# Patient Record
Sex: Male | Born: 1965 | Race: White | Hispanic: No | Marital: Married | Smoking: Former smoker
Health system: Southern US, Community
[De-identification: ages and names within clinical notes are randomized; demographics above are authoritative.]

## PROBLEM LIST (undated history)

## (undated) DIAGNOSIS — E119 Type 2 diabetes mellitus without complications: Secondary | ICD-10-CM

## (undated) DIAGNOSIS — I219 Acute myocardial infarction, unspecified: Secondary | ICD-10-CM

## (undated) DIAGNOSIS — E279 Disorder of adrenal gland, unspecified: Secondary | ICD-10-CM

## (undated) DIAGNOSIS — E785 Hyperlipidemia, unspecified: Secondary | ICD-10-CM

## (undated) HISTORY — PX: CORONARY ARTERY BYPASS GRAFT: SHX141

## (undated) HISTORY — DX: Hyperlipidemia, unspecified: E78.5

## (undated) HISTORY — DX: Disorder of adrenal gland, unspecified: E27.9

## (undated) HISTORY — PX: OTHER SURGICAL HISTORY: SHX169

## (undated) HISTORY — PX: MITRAL VALVE REPAIR: SHX2039

## (undated) HISTORY — DX: Acute myocardial infarction, unspecified: I21.9

## (undated) HISTORY — DX: Type 2 diabetes mellitus without complications: E11.9

---

## 2000-10-06 ENCOUNTER — Encounter: Admission: RE | Admit: 2000-10-06 | Discharge: 2000-10-06 | Payer: Self-pay | Admitting: Occupational Medicine

## 2000-10-06 ENCOUNTER — Encounter: Payer: Self-pay | Admitting: Occupational Medicine

## 2015-11-20 ENCOUNTER — Ambulatory Visit (INDEPENDENT_AMBULATORY_CARE_PROVIDER_SITE_OTHER): Payer: 59 | Admitting: "Endocrinology

## 2015-11-20 ENCOUNTER — Encounter: Payer: Self-pay | Admitting: "Endocrinology

## 2015-11-20 VITALS — BP 137/86 | HR 78 | Ht 70.0 in | Wt 263.0 lb

## 2015-11-20 DIAGNOSIS — E1159 Type 2 diabetes mellitus with other circulatory complications: Secondary | ICD-10-CM | POA: Diagnosis not present

## 2015-11-20 DIAGNOSIS — E230 Hypopituitarism: Secondary | ICD-10-CM

## 2015-11-20 MED ORDER — METFORMIN HCL 500 MG PO TABS: 500.0000 mg | ORAL_TABLET | Freq: Two times a day (BID) | ORAL | Status: DC

## 2015-11-20 NOTE — Progress Notes (Signed)
Subjective:    Patient ID: Ernest DoneDavid R Mcgloin, male    DOB: 1966/08/12. Patient is being seen in consultation for management of diabetes requested by  DAVIS,STEPHEN, MD  Past Medical History  Diagnosis Date  . Diabetes mellitus, type II (HCC)   . Hyperlipidemia   . Heart attack (HCC)   . Adrenal disease [facility](HCC)    Past Surgical History  Procedure Laterality Date  . Mitral valve repair    . Coronary artery bypass graft    . Removal of cricopharyngeoma     Social History   Social History  . Marital Status: Married    Spouse Name: N/A  . Number of Children: N/A  . Years of Education: N/A   Social History Main Topics  . Smoking status: Former Games developermoker  . Smokeless tobacco: None  . Alcohol Use: No  . Drug Use: No  . Sexual Activity: Not Asked   Other Topics Concern  . None   Social History Narrative  . None   Outpatient Encounter Prescriptions as of 11/20/2015  Medication Sig  . aspirin 81 MG tablet Take 81 mg by mouth daily.  Marland Kitchen. atorvastatin (LIPITOR) 20 MG tablet Take 20 mg by mouth daily.  . busPIRone (BUSPAR) 15 MG tablet Take 15 mg by mouth 2 (two) times daily.  . clonazePAM (KLONOPIN) 0.5 MG tablet Take 0.5 mg by mouth 3 (three) times daily as needed for anxiety.  Marland Kitchen. desmopressin (DDAVP) 0.1 MG tablet Take 0.1 mg by mouth 2 (two) times daily.  Marland Kitchen. FLUoxetine (PROZAC) 20 MG tablet Take 20 mg by mouth daily.  . hydrocortisone (CORTEF) 5 MG tablet Take 10 mg by mouth 2 (two) times daily.  Marland Kitchen. levothyroxine (SYNTHROID, LEVOTHROID) 112 MCG tablet Take 112 mcg by mouth daily before breakfast.  . potassium chloride SA (K-DUR,KLOR-CON) 20 MEQ tablet Take 20 mEq by mouth daily.  Marland Kitchen. testosterone cypionate (DEPOTESTOTERONE CYPIONATE) 100 MG/ML injection Inject 100 mg into the muscle every 14 (fourteen) days.  . [DISCONTINUED] PIOGLITAZONE HCL PO Take 10 mg by mouth daily.  . metFORMIN (GLUCOPHAGE) 500 MG tablet Take 1 tablet (500 mg total) by mouth 2 (two) times daily with a meal.   No  facility-administered encounter medications on file as of 11/20/2015.   ALLERGIES: No Known Allergies VACCINATION STATUS:  There is no immunization history on file for this patient.  Diabetes He presents for his initial diabetic visit. He has type 2 diabetes mellitus. Onset time: He was diagnosed at approximate age of 50 years. His disease course has been worsening. There are no hypoglycemic associated symptoms. Pertinent negatives for hypoglycemia include no confusion, headaches, pallor or seizures. Associated symptoms include polydipsia and polyuria. Pertinent negatives for diabetes include no chest pain, no fatigue, no polyphagia and no weakness. There are no hypoglycemic complications. Symptoms are worsening. There are no diabetic complications. (Triple bypass and mitral valve repair.) Risk factors for coronary artery disease include diabetes mellitus, dyslipidemia, hypertension, obesity, tobacco exposure and sedentary lifestyle. His weight is increasing steadily. He is following a generally unhealthy diet. When asked about meal planning, he reported none. He has not had a previous visit with a dietitian. He never participates in exercise. Home blood sugar record trend: He did not bring any meter nor blood glucose logs to review. An ACE inhibitor/angiotensin II receptor blocker is being taken.  Hyperlipidemia This is a chronic problem. Pertinent negatives include no chest pain, myalgias or shortness of breath. Current antihyperlipidemic treatment includes statins. Risk factors for coronary  artery disease include diabetes mellitus, dyslipidemia, hypertension, male sex, obesity and a sedentary lifestyle.  Hypertension This is a chronic problem. The current episode started more than 1 year ago. Pertinent negatives include no chest pain, headaches, neck pain, palpitations or shortness of breath. Risk factors for coronary artery disease include diabetes mellitus, dyslipidemia, male gender, obesity,  smoking/tobacco exposure and sedentary lifestyle. Hypertensive end-organ damage includes kidney disease and CAD/MI.      Review of Systems  Constitutional: Negative for fever, chills, fatigue and unexpected weight change.  HENT: Negative for dental problem, mouth sores and trouble swallowing.   Eyes: Negative for visual disturbance.  Respiratory: Negative for cough, choking, chest tightness, shortness of breath and wheezing.   Cardiovascular: Negative for chest pain, palpitations and leg swelling.  Gastrointestinal: Negative for nausea, vomiting, abdominal pain, diarrhea, constipation and abdominal distention.  Endocrine: Positive for polydipsia and polyuria. Negative for polyphagia.  Genitourinary: Negative for dysuria, urgency, hematuria and flank pain.  Musculoskeletal: Negative for myalgias, back pain, gait problem and neck pain.  Skin: Negative for pallor, rash and wound.  Neurological: Negative for seizures, syncope, weakness, numbness and headaches.  Psychiatric/Behavioral: Negative.  Negative for confusion and dysphoric mood.    Objective:    BP 137/86 mmHg  Pulse 78  Ht  (1.778 m)  Wt 263 lb (119.296 kg)  BMI 37.74 kg/m2  SpO2 95%  Wt Readings from Last 3 Encounters:  11/20/15 263 lb (119.296 kg)    Physical Exam  Constitutional: He is oriented to person, place, and time. He appears well-developed and well-nourished. He is cooperative. No distress.  HENT:  Head: Normocephalic and atraumatic.  Eyes: EOM are normal.  Neck: Normal range of motion. Neck supple. No tracheal deviation present. No thyromegaly present.  Cardiovascular: Normal rate, S1 normal, S2 normal and normal heart sounds.  Exam reveals no gallop.   No murmur heard. Pulses:      Dorsalis pedis pulses are 1+ on the right side, and 1+ on the left side.       Posterior tibial pulses are 1+ on the right side, and 1+ on the left side.  Pulmonary/Chest: Breath sounds normal. No respiratory distress. He  has no wheezes.  Abdominal: Soft. Bowel sounds are normal. He exhibits no distension. There is no tenderness. There is no guarding and no CVA tenderness.  Musculoskeletal: He exhibits no edema.       Right shoulder: He exhibits no swelling and no deformity.  Neurological: He is alert and oriented to person, place, and time. He has normal strength and normal reflexes. No cranial nerve deficit or sensory deficit. Gait normal.  Skin: Skin is warm and dry. No rash noted. No cyanosis. Nails show no clubbing.  Psychiatric: He has a normal mood and affect. His speech is normal and behavior is normal. Judgment and thought content normal. Cognition and memory are normal.      Assessment & Plan:   1. DM type 2 causing vascular disease (HCC) 2. Hyperlipidemia 3.hypertension 4. Panhypopituitarism (HCC)  - Patient has currently uncontrolled symptomatic type 2 DM since  50 years of age,  with most recent A1c of 10.4 %. Recent labs reviewed.   His diabetes is complicated by coronary artery disease status post triple bypass and mitral valve repair and patient remains at a high risk for more acute and chronic complications of diabetes which include CAD, CVA, CKD, retinopathy, and neuropathy. These are all discussed in detail with the patient.  - I have counseled  the patient on diet management and weight loss, by adopting a carbohydrate restricted/protein rich diet.  - Suggestion is made for patient to avoid simple carbohydrates   from their diet including Cakes , Desserts, Ice Cream,  Soda (  diet and regular) , Sweet Tea , Candies,  Chips, Cookies, Artificial Sweeteners,   and "Sugar-free" Products . This will help patient to have stable blood glucose profile and potentially avoid unintended weight gain.  - I encouraged the patient to switch to  unprocessed or minimally processed complex starch and increased protein intake (animal or plant source), fruits, and vegetables.  - Patient is advised to stick  to a routine mealtimes to eat 3 meals  a day and avoid unnecessary snacks ( to snack only to correct hypoglycemia).  - The patient will be scheduled with Norm Salt, RDN, CDE for individualized DM education.  - I have approached patient with the following individualized plan to manage diabetes and patient agrees:   - I  will proceed to initiate strict monitoring of glucose  AC and HS.  -Patient is encouraged to call clinic for blood glucose levels less than 70 or above 300 mg /dl. - I will initiate low-dose metformin 500 mg by mouth twice a day, therapeutically suitable for patient. - I will discontinue pioglitazone, risk outweighs benefit for this patient. -If he has significant hyperglycemia, he will be considered for a brief insulin therapy.   - Patient specific target  A1c;  LDL, HDL, Triglycerides, and  Waist Circumference were discussed in detail.  2) BP/HTN: Controlled. Continue current medications including ACEI/ARB. 3) Lipids/HPL:  Uncontrolled, continue statins. 4)  Weight/Diet: CDE Consult will be initiated , exercise, and detailed carbohydrates information provided.  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 He is on multiple hormonal replacements including levothyroxine, hydrocortisone, desmopressin, and testosterone. I advised him to lower his testosterone to 100 mg IM every 2 weeks due to recent labs showing supraphysiologic testosterone level at 1080. Treatment target for him will be testosterone level between 400-600. 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  5) Chronic Care/Health Maintenance:  -Patient  on ACEI/ARB and Statin medications and encouraged to continue to follow up with Ophthalmology, Podiatrist at least yearly or according to recommendations, and advised to   stay away from smoking. I have recommended yearly flu vaccine and pneumonia vaccination  at least every 5 years; moderate intensity exercise for up to 150 minutes weekly; and  sleep for at least 7 hours a day.  - 60 minutes of time was spent on the care of this patient , 50% of which was applied for counseling on diabetes complications and their preventions.  - Patient to bring meter and  blood glucose logs during their next visit.   - I advised patient to maintain close follow up with DAVIS,STEPHEN, MD for primary care needs.  Follow up plan: - Return in about 1 week (around 11/27/2015) for diabetes, follow up with meter and logs- no labs.  Marquis Lunch, MD Phone: [phone]  Fax: [phone]   11/20/2015, 5:23 PM

## 2015-11-20 NOTE — Patient Instructions (Signed)

## 2015-12-01 ENCOUNTER — Encounter: Payer: Self-pay | Admitting: "Endocrinology

## 2015-12-01 ENCOUNTER — Ambulatory Visit (INDEPENDENT_AMBULATORY_CARE_PROVIDER_SITE_OTHER): Payer: 59 | Admitting: "Endocrinology

## 2015-12-01 VITALS — BP 133/83 | HR 62 | Ht 70.0 in | Wt 262.0 lb

## 2015-12-01 DIAGNOSIS — E1159 Type 2 diabetes mellitus with other circulatory complications: Secondary | ICD-10-CM

## 2015-12-01 DIAGNOSIS — E230 Hypopituitarism: Secondary | ICD-10-CM | POA: Diagnosis not present

## 2015-12-01 MED ORDER — TESTOSTERONE CYPIONATE 100 MG/ML IM SOLN: 100.0000 mg | INTRAMUSCULAR | Status: DC

## 2015-12-01 NOTE — Progress Notes (Signed)
Subjective:    Patient ID: Ernest Graham, male    DOB: 1966-07-26. Patient is being seen in Follow-up after he was seen on consultation for management of diabetes requested by  DAVIS,STEPHEN, MD  Past Medical History  Diagnosis Date  . Diabetes mellitus, type II (HCC)   . Hyperlipidemia   . Heart attack (HCC)   . Adrenal disease [facility])    Past Surgical History  Procedure Laterality Date  . Mitral valve repair    . Coronary artery bypass graft    . Removal of cricopharyngeoma     Social History   Social History  . Marital Status: Married    Spouse Name: N/A  . Number of Children: N/A  . Years of Education: N/A   Social History Main Topics  . Smoking status: Former Games developer  . Smokeless tobacco: None  . Alcohol Use: No  . Drug Use: No  . Sexual Activity: Not Asked   Other Topics Concern  . None   Social History Narrative   Outpatient Encounter Prescriptions as of 12/01/2015  Medication Sig  . aspirin 81 MG tablet Take 81 mg by mouth daily.  Marland Kitchen atorvastatin (LIPITOR) 20 MG tablet Take 20 mg by mouth daily.  . busPIRone (BUSPAR) 15 MG tablet Take 15 mg by mouth 2 (two) times daily.  . clonazePAM (KLONOPIN) 0.5 MG tablet Take 0.5 mg by mouth 3 (three) times daily as needed for anxiety.  Marland Kitchen desmopressin (DDAVP) 0.1 MG tablet Take 0.1 mg by mouth 2 (two) times daily.  Marland Kitchen FLUoxetine (PROZAC) 20 MG tablet Take 20 mg by mouth daily.  . hydrocortisone (CORTEF) 5 MG tablet Take 10 mg by mouth 2 (two) times daily.  Marland Kitchen levothyroxine (SYNTHROID, LEVOTHROID) 112 MCG tablet Take 112 mcg by mouth daily before breakfast.  . metFORMIN (GLUCOPHAGE) 500 MG tablet Take 1 tablet (500 mg total) by mouth 2 (two) times daily with a meal.  . potassium chloride SA (K-DUR,KLOR-CON) 20 MEQ tablet Take 20 mEq by mouth daily.  Marland Kitchen testosterone cypionate (DEPOTESTOTERONE CYPIONATE) 100 MG/ML injection Inject 1 mL (100 mg total) into the muscle every 14 (fourteen) days.  . [DISCONTINUED] testosterone  cypionate (DEPOTESTOTERONE CYPIONATE) 100 MG/ML injection Inject 100 mg into the muscle every 14 (fourteen) days.   No facility-administered encounter medications on file as of 12/01/2015.   ALLERGIES: No Known Allergies VACCINATION STATUS:  There is no immunization history on file for this patient.  Diabetes He presents for his follow-up diabetic visit. He has type 2 diabetes mellitus. Onset time: He was diagnosed at approximate age of 12 years. His disease course has been worsening. There are no hypoglycemic associated symptoms. Pertinent negatives for hypoglycemia include no confusion, headaches, pallor or seizures. Associated symptoms include polydipsia and polyuria. Pertinent negatives for diabetes include no chest pain, no fatigue, no polyphagia and no weakness. There are no hypoglycemic complications. Symptoms are worsening. There are no diabetic complications. (Triple bypass and mitral valve repair.) Risk factors for coronary artery disease include diabetes mellitus, dyslipidemia, hypertension, obesity, tobacco exposure and sedentary lifestyle. His weight is stable. He is following a generally unhealthy diet. When asked about meal planning, he reported none. He has not had a previous visit with a dietitian. He never participates in exercise. Home blood sugar record trend: He brought a meter and log for the last week showing order readings are on target. His breakfast blood glucose range is generally 110-130 mg/dl. His lunch blood glucose range is generally 110-130 mg/dl. His  dinner blood glucose range is generally 110-130 mg/dl. His overall blood glucose range is 110-130 mg/dl. An ACE inhibitor/angiotensin II receptor blocker is being taken.  Hyperlipidemia This is a chronic problem. Pertinent negatives include no chest pain, myalgias or shortness of breath. Current antihyperlipidemic treatment includes statins. Risk factors for coronary artery disease include diabetes mellitus, dyslipidemia,  hypertension, male sex, obesity and a sedentary lifestyle.  Hypertension This is a chronic problem. The current episode started more than 1 year ago. Pertinent negatives include no chest pain, headaches, neck pain, palpitations or shortness of breath. Risk factors for coronary artery disease include diabetes mellitus, dyslipidemia, male gender, obesity, smoking/tobacco exposure and sedentary lifestyle. Hypertensive end-organ damage includes kidney disease and CAD/MI.      Review of Systems  Constitutional: Negative for fever, chills, fatigue and unexpected weight change.  HENT: Negative for dental problem, mouth sores and trouble swallowing.   Eyes: Negative for visual disturbance.  Respiratory: Negative for cough, choking, chest tightness, shortness of breath and wheezing.   Cardiovascular: Negative for chest pain, palpitations and leg swelling.  Gastrointestinal: Negative for nausea, vomiting, abdominal pain, diarrhea, constipation and abdominal distention.  Endocrine: Positive for polydipsia and polyuria. Negative for polyphagia.  Genitourinary: Negative for dysuria, urgency, hematuria and flank pain.  Musculoskeletal: Negative for myalgias, back pain, gait problem and neck pain.  Skin: Negative for pallor, rash and wound.  Neurological: Negative for seizures, syncope, weakness, numbness and headaches.  Psychiatric/Behavioral: Negative.  Negative for confusion and dysphoric mood.    Objective:    BP 133/83 mmHg  Pulse 62  Ht 5\' 10"  (1.778 m)  Wt 262 lb (118.842 kg)  BMI 37.59 kg/m2  SpO2 96%  Wt Readings from Last 3 Encounters:  12/01/15 262 lb (118.842 kg)  11/20/15 263 lb (119.296 kg)    Physical Exam  Constitutional: He is oriented to person, place, and time. He appears well-developed and well-nourished. He is cooperative. No distress.  HENT:  Head: Normocephalic and atraumatic.  Eyes: EOM are normal.  Neck: Normal range of motion. Neck supple. No tracheal deviation  present. No thyromegaly present.  Cardiovascular: Normal rate, S1 normal, S2 normal and normal heart sounds.  Exam reveals no gallop.   No murmur heard. Pulses:      Dorsalis pedis pulses are 1+ on the right side, and 1+ on the left side.       Posterior tibial pulses are 1+ on the right side, and 1+ on the left side.  Pulmonary/Chest: Breath sounds normal. No respiratory distress. He has no wheezes.  Abdominal: Soft. Bowel sounds are normal. He exhibits no distension. There is no tenderness. There is no guarding and no CVA tenderness.  Musculoskeletal: He exhibits no edema.       Right shoulder: He exhibits no swelling and no deformity.  Neurological: He is alert and oriented to person, place, and time. He has normal strength and normal reflexes. No cranial nerve deficit or sensory deficit. Gait normal.  Skin: Skin is warm and dry. No rash noted. No cyanosis. Nails show no clubbing.  Psychiatric: He has a normal mood and affect. His speech is normal and behavior is normal. Judgment and thought content normal. Cognition and memory are normal.      Assessment & Plan:   1. DM type 2 causing vascular disease (HCC) 2. Hyperlipidemia 3.hypertension 4. Panhypopituitarism (HCC)  - Patient has currently uncontrolled symptomatic type 2 DM since  50 years of age,  with most recent A1c of 10.4 %.  Recent labs reviewed.   His diabetes is complicated by coronary artery disease status post triple bypass and mitral valve repair and patient remains at a high risk for more acute and chronic complications of diabetes which include CAD, CVA, CKD, retinopathy, and neuropathy. These are all discussed in detail with the patient.  - I have counseled the patient on diet management and weight loss, by adopting a carbohydrate restricted/protein rich diet.  - Suggestion is made for patient to avoid simple carbohydrates   from their diet including Cakes , Desserts, Ice Cream,  Soda (  diet and regular) , Sweet Tea  , Candies,  Chips, Cookies, Artificial Sweeteners,   and "Sugar-free" Products . This will help patient to have stable blood glucose profile and potentially avoid unintended weight gain.  - I encouraged the patient to switch to  unprocessed or minimally processed complex starch and increased protein intake (animal or plant source), fruits, and vegetables.  - Patient is advised to stick to a routine mealtimes to eat 3 meals  a day and avoid unnecessary snacks ( to snack only to correct hypoglycemia).  - The patient will be scheduled with Norm Salt, RDN, CDE for individualized DM education.  - I have approached patient with the following individualized plan to manage diabetes and patient agrees:  - I will continue only with low-dose metformin 500 mg by mouth twice a day, therapeutically suitable for patient. -He will not need insulin therapy for now.  - Patient specific target  A1c;  LDL, HDL, Triglycerides, and  Waist Circumference were discussed in detail.  2) BP/HTN: Controlled. Continue current medications including ACEI/ARB. 3) Lipids/HPL:  Uncontrolled, continue statins. 4)  Weight/Diet: CDE Consult will be initiated , exercise, and detailed carbohydrates information provided.  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 He is on multiple hormonal replacements including levothyroxine, hydrocortisone, desmopressin, and testosterone. I advised him to lower his testosterone to 100 mg IM every 2 weeks due to recent labs showing supraphysiologic testosterone level at 1080. Treatment target for him will be testosterone level between 400-600. 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 -Would have  requested a copy of his last MRI which is pending.  5) Chronic Care/Health Maintenance:  -Patient  on ACEI/ARB and Statin medications and encouraged to continue to follow up with  Ophthalmology, Podiatrist at least yearly or according to recommendations, and advised to   stay away from smoking. I have recommended yearly flu vaccine and pneumonia vaccination at least every 5 years; moderate intensity exercise for up to 150 minutes weekly; and  sleep for at least 7 hours a day.  - 25 minutes of time was spent on the care of this patient , 50% of which was applied for counseling on diabetes complications and their preventions.  - Patient to bring meter and  blood glucose logs during their next visit.   - I advised patient to maintain close follow up with DAVIS,STEPHEN, MD for primary care needs.  Follow up plan: - Return in about 8 weeks (around 01/26/2016) for diabetes, underactive thyroid, follow up with pre-visit labs.  Marquis Lunch, MD Phone: [phone]  Fax: [phone]   12/01/2015, 5:00 PM

## 2015-12-01 NOTE — Patient Instructions (Signed)

## 2016-01-20 LAB — HEMOGLOBIN A1C: HEMOGLOBIN A1C: 7.3

## 2016-01-20 LAB — LIPID PANEL
HDL: 21 mg/dL — AB (ref 35–70)
TRIGLYCERIDES: 416 mg/dL — AB (ref 40–160)

## 2016-01-26 ENCOUNTER — Ambulatory Visit (INDEPENDENT_AMBULATORY_CARE_PROVIDER_SITE_OTHER): Payer: 59 | Admitting: "Endocrinology

## 2016-01-26 ENCOUNTER — Encounter: Payer: Self-pay | Admitting: "Endocrinology

## 2016-01-26 VITALS — BP 122/77 | HR 63 | Ht 70.0 in | Wt 262.0 lb

## 2016-01-26 DIAGNOSIS — E1159 Type 2 diabetes mellitus with other circulatory complications: Secondary | ICD-10-CM

## 2016-01-26 DIAGNOSIS — E230 Hypopituitarism: Secondary | ICD-10-CM | POA: Diagnosis not present

## 2016-01-26 DIAGNOSIS — E559 Vitamin D deficiency, unspecified: Secondary | ICD-10-CM

## 2016-01-26 MED ORDER — VITAMIN D3 125 MCG (5000 UT) PO CAPS: 5000.0000 [IU] | ORAL_CAPSULE | Freq: Every day | ORAL | Status: DC

## 2016-01-26 NOTE — Patient Instructions (Signed)

## 2016-01-26 NOTE — Progress Notes (Signed)
Subjective:    Patient ID: Ernest Graham, male    DOB: February 20, 1966. Patient is being seen in Follow-up after he was seen on consultation for management of diabetes requested by  DAVIS,STEPHEN, MD  Past Medical History  Diagnosis Date  . Diabetes mellitus, type II (HCC)   . Hyperlipidemia   . Heart attack (HCC)   . Adrenal disease [facility])    Past Surgical History  Procedure Laterality Date  . Mitral valve repair    . Coronary artery bypass graft    . Removal of cricopharyngeoma     Social History   Social History  . Marital Status: Married    Spouse Name: N/A  . Number of Children: N/A  . Years of Education: N/A   Social History Main Topics  . Smoking status: Former Games developer  . Smokeless tobacco: None  . Alcohol Use: No  . Drug Use: No  . Sexual Activity: Not Asked   Other Topics Concern  . None   Social History Narrative   Outpatient Encounter Prescriptions as of 01/26/2016  Medication Sig  . aspirin 81 MG tablet Take 81 mg by mouth daily.  Marland Kitchen atorvastatin (LIPITOR) 20 MG tablet Take 20 mg by mouth daily.  . busPIRone (BUSPAR) 15 MG tablet Take 15 mg by mouth 2 (two) times daily.  . Cholecalciferol (VITAMIN D3) 5000 units CAPS Take 1 capsule (5,000 Units total) by mouth daily.  . clonazePAM (KLONOPIN) 0.5 MG tablet Take 0.5 mg by mouth 3 (three) times daily as needed for anxiety.  Marland Kitchen desmopressin (DDAVP) 0.1 MG tablet Take 0.1 mg by mouth 2 (two) times daily.  Marland Kitchen FLUoxetine (PROZAC) 20 MG tablet Take 20 mg by mouth daily.  . hydrocortisone (CORTEF) 5 MG tablet Take 10 mg by mouth 2 (two) times daily.  Marland Kitchen levothyroxine (SYNTHROID, LEVOTHROID) 112 MCG tablet Take 112 mcg by mouth daily before breakfast.  . metFORMIN (GLUCOPHAGE) 500 MG tablet Take 1 tablet (500 mg total) by mouth 2 (two) times daily with a meal.  . potassium chloride SA (K-DUR,KLOR-CON) 20 MEQ tablet Take 20 mEq by mouth daily.  Marland Kitchen testosterone cypionate (DEPOTESTOTERONE CYPIONATE) 100 MG/ML injection Inject  1 mL (100 mg total) into the muscle every 14 (fourteen) days.   No facility-administered encounter medications on file as of 01/26/2016.   ALLERGIES: No Known Allergies VACCINATION STATUS:  There is no immunization history on file for this patient.  Diabetes He presents for his follow-up diabetic visit. He has type 2 diabetes mellitus. Onset time: He was diagnosed at approximate age of 50 years. His disease course has been worsening. There are no hypoglycemic associated symptoms. Pertinent negatives for hypoglycemia include no confusion, headaches, pallor or seizures. Associated symptoms include polydipsia and polyuria. Pertinent negatives for diabetes include no chest pain, no fatigue, no polyphagia and no weakness. There are no hypoglycemic complications. Symptoms are worsening. There are no diabetic complications. (Triple bypass and mitral valve repair.) Risk factors for coronary artery disease include diabetes mellitus, dyslipidemia, hypertension, obesity, tobacco exposure and sedentary lifestyle. His weight is stable. He is following a generally unhealthy diet. When asked about meal planning, he reported none. He has not had a previous visit with a dietitian. He never participates in exercise. Home blood sugar record trend: He brought a meter and log for the last week showing order readings are on target. His breakfast blood glucose range is generally 110-130 mg/dl. His lunch blood glucose range is generally 110-130 mg/dl. His dinner blood glucose  range is generally 110-130 mg/dl. His overall blood glucose range is 110-130 mg/dl. An ACE inhibitor/angiotensin II receptor blocker is being taken.  Hyperlipidemia This is a chronic problem. Pertinent negatives include no chest pain, myalgias or shortness of breath. Current antihyperlipidemic treatment includes statins. Risk factors for coronary artery disease include diabetes mellitus, dyslipidemia, hypertension, male sex, obesity and a sedentary  lifestyle.  Hypertension This is a chronic problem. The current episode started more than 1 year ago. Pertinent negatives include no chest pain, headaches, neck pain, palpitations or shortness of breath. Risk factors for coronary artery disease include diabetes mellitus, dyslipidemia, male gender, obesity, smoking/tobacco exposure and sedentary lifestyle. Hypertensive end-organ damage includes kidney disease and CAD/MI.      Review of Systems  Constitutional: Negative for fever, chills, fatigue and unexpected weight change.  HENT: Negative for dental problem, mouth sores and trouble swallowing.   Eyes: Negative for visual disturbance.  Respiratory: Negative for cough, choking, chest tightness, shortness of breath and wheezing.   Cardiovascular: Negative for chest pain, palpitations and leg swelling.  Gastrointestinal: Negative for nausea, vomiting, abdominal pain, diarrhea, constipation and abdominal distention.  Endocrine: Positive for polydipsia and polyuria. Negative for polyphagia.  Genitourinary: Negative for dysuria, urgency, hematuria and flank pain.  Musculoskeletal: Negative for myalgias, back pain, gait problem and neck pain.  Skin: Negative for pallor, rash and wound.  Neurological: Negative for seizures, syncope, weakness, numbness and headaches.  Psychiatric/Behavioral: Negative.  Negative for confusion and dysphoric mood.    Objective:    BP 122/77 mmHg  Pulse 63  Ht 5\' 10"  (1.778 m)  Wt 262 lb (118.842 kg)  BMI 37.59 kg/m2  SpO2 98%  Wt Readings from Last 3 Encounters:  01/26/16 262 lb (118.842 kg)  12/01/15 262 lb (118.842 kg)  11/20/15 263 lb (119.296 kg)    Physical Exam  Constitutional: He is oriented to person, place, and time. He appears well-developed and well-nourished. He is cooperative. No distress.  HENT:  Head: Normocephalic and atraumatic.  Eyes: EOM are normal.  Neck: Normal range of motion. Neck supple. No tracheal deviation present. No  thyromegaly present.  Cardiovascular: Normal rate, S1 normal, S2 normal and normal heart sounds.  Exam reveals no gallop.   No murmur heard. Pulses:      Dorsalis pedis pulses are 1+ on the right side, and 1+ on the left side.       Posterior tibial pulses are 1+ on the right side, and 1+ on the left side.  Pulmonary/Chest: Breath sounds normal. No respiratory distress. He has no wheezes.  Abdominal: Soft. Bowel sounds are normal. He exhibits no distension. There is no tenderness. There is no guarding and no CVA tenderness.  Musculoskeletal: He exhibits no edema.       Right shoulder: He exhibits no swelling and no deformity.  Neurological: He is alert and oriented to person, place, and time. He has normal strength and normal reflexes. No cranial nerve deficit or sensory deficit. Gait normal.  Skin: Skin is warm and dry. No rash noted. No cyanosis. Nails show no clubbing.  Psychiatric: He has a normal mood and affect. His speech is normal and behavior is normal. Judgment and thought content normal. Cognition and memory are normal.      Assessment & Plan:   1. DM type 2 causing vascular disease (HCC) 2. Hyperlipidemia 3.hypertension 4. Panhypopituitarism (HCC) 5. Vitamin D deficiency  - Patient has currently uncontrolled symptomatic type 2 DM since  50 years of age,  with most recent A1c of  7.3% improving from 10.4 %. Recent labs reviewed.   His diabetes is complicated by coronary artery disease status post triple bypass and mitral valve repair and patient remains at a high risk for more acute and chronic complications of diabetes which include CAD, CVA, CKD, retinopathy, and neuropathy. These are all discussed in detail with the patient.  - I have counseled the patient on diet management and weight loss, by adopting a carbohydrate restricted/protein rich diet.  - Suggestion is made for patient to avoid simple carbohydrates   from their diet including Cakes , Desserts, Ice Cream,  Soda  (  diet and regular) , Sweet Tea , Candies,  Chips, Cookies, Artificial Sweeteners,   and "Sugar-free" Products . This will help patient to have stable blood glucose profile and potentially avoid unintended weight gain.  - I encouraged the patient to switch to  unprocessed or minimally processed complex starch and increased protein intake (animal or plant source), fruits, and vegetables.  - Patient is advised to stick to a routine mealtimes to eat 3 meals  a day and avoid unnecessary snacks ( to snack only to correct hypoglycemia).  - The patient will be scheduled with Norm Salt, RDN, CDE for individualized DM education.  - I have approached patient with the following individualized plan to manage diabetes and patient agrees:  - I will continue only with low-dose metformin 500 mg by mouth twice a day, therapeutically suitable for patient. -He will not need insulin therapy for now.  - Patient specific target  A1c;  LDL, HDL, Triglycerides, and  Waist Circumference were discussed in detail.  2) BP/HTN: Controlled. Continue current medications including ACEI/ARB. 3) Lipids/HPL:  Uncontrolled, continue statins. 4)  Weight/Diet: CDE Consult will be initiated , exercise, and detailed carbohydrates information provided.  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 He is on multiple hormonal replacements including levothyroxine, hydrocortisone, desmopressin, and testosterone. I advised him to lower his testosterone to 100 mg IM every 2 weeks due to recent labs showing supraphysiologic testosterone level at 1080. Treatment target for him will be testosterone level between 400-600. 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 -MRI of pituitary/sella from 2010 was significant for surgical changes with no evidence of residual tissue.  5) Chronic Care/Health Maintenance:  -Patient  on ACEI/ARB  and Statin medications and encouraged to continue to follow up with Ophthalmology, Podiatrist at least yearly or according to recommendations, and advised to   stay away from smoking. I have recommended yearly flu vaccine and pneumonia vaccination at least every 5 years; moderate intensity exercise for up to 150 minutes weekly; and  sleep for at least 7 hours a day. -I have initiated vitamin D replacement with 5000 units of vitamin D3 daily for 90 days. - 25 minutes of time was spent on the care of this patient , 50% of which was applied for counseling on diabetes complications and their preventions.  - Patient to bring meter and  blood glucose logs during their next visit.   - I advised patient to maintain close follow up with DAVIS,STEPHEN, MD for primary care needs.  Follow up plan: - Return in about 3 months (around 04/27/2016), or  Request last MRI report pituitary /sella from [facility] for diabetes, high blood pressure, high cholesterol, underactive thyroid, follow up with pre-visit labs.  Marquis Lunch, MD Phone: [phone]  Fax: [phone]  01/26/2016, 10:33 AM

## 2016-04-30 ENCOUNTER — Other Ambulatory Visit: Payer: Self-pay | Admitting: "Endocrinology

## 2016-05-14 ENCOUNTER — Encounter: Payer: Self-pay | Admitting: "Endocrinology

## 2016-05-14 ENCOUNTER — Ambulatory Visit (INDEPENDENT_AMBULATORY_CARE_PROVIDER_SITE_OTHER): Payer: 59 | Admitting: "Endocrinology

## 2016-05-14 VITALS — BP 135/79 | HR 56 | Ht 70.0 in | Wt 266.0 lb

## 2016-05-14 DIAGNOSIS — E1159 Type 2 diabetes mellitus with other circulatory complications: Secondary | ICD-10-CM

## 2016-05-14 DIAGNOSIS — E230 Hypopituitarism: Secondary | ICD-10-CM | POA: Diagnosis not present

## 2016-05-14 DIAGNOSIS — E559 Vitamin D deficiency, unspecified: Secondary | ICD-10-CM | POA: Diagnosis not present

## 2016-05-14 NOTE — Progress Notes (Signed)
Subjective:    Patient ID: Ernest Graham, male    DOB: 1966-01-11. Patient is being seen in Follow-up after he was seen on Follow-up for management of diabetes , history panhypopituitarism requested by  DAVIS,STEPHEN, MD  Past Medical History:  Diagnosis Date  . Adrenal disease (HCC)   . Diabetes mellitus, type II (HCC)   . Heart attack (HCC)   . Hyperlipidemia    Past Surgical History:  Procedure Laterality Date  . CORONARY ARTERY BYPASS GRAFT    . MITRAL VALVE REPAIR    . Removal of Cricopharyngeoma     Social History   Social History  . Marital status: Married    Spouse name: N/A  . Number of children: N/A  . Years of education: N/A   Social History Main Topics  . Smoking status: Former Games developer  . Smokeless tobacco: Never Used  . Alcohol use No  . Drug use: No  . Sexual activity: Not Asked   Other Topics Concern  . None   Social History Narrative  . None   Outpatient Encounter Prescriptions as of 05/14/2016  Medication Sig  . aspirin 81 MG tablet Take 81 mg by mouth daily.  Marland Kitchen atorvastatin (LIPITOR) 20 MG tablet Take 20 mg by mouth daily.  . busPIRone (BUSPAR) 15 MG tablet Take 15 mg by mouth 2 (two) times daily.  . clonazePAM (KLONOPIN) 0.5 MG tablet Take 0.5 mg by mouth 3 (three) times daily as needed for anxiety.  . [employer] VIT D 5000 HIGH-POTENCY 5000 units capsule TAKE ONE CAPSULE BY MOUTH DAILY  . desmopressin (DDAVP) 0.1 MG tablet Take 0.1 mg by mouth 2 (two) times daily.  Marland Kitchen FLUoxetine (PROZAC) 20 MG tablet Take 20 mg by mouth daily.  . hydrocortisone (CORTEF) 5 MG tablet Take 10 mg by mouth 2 (two) times daily.  Marland Kitchen levothyroxine (SYNTHROID, LEVOTHROID) 112 MCG tablet Take 112 mcg by mouth daily before breakfast.  . metFORMIN (GLUCOPHAGE) 500 MG tablet Take 1 tablet (500 mg total) by mouth 2 (two) times daily with a meal.  . potassium chloride SA (K-DUR,KLOR-CON) 20 MEQ tablet Take 20 mEq by mouth daily.  Marland Kitchen testosterone cypionate (DEPOTESTOTERONE CYPIONATE) 100  MG/ML injection Inject 1 mL (100 mg total) into the muscle every 14 (fourteen) days.   No facility-administered encounter medications on file as of 05/14/2016.    ALLERGIES: No Known Allergies VACCINATION STATUS:  There is no immunization history on file for this patient.  Diabetes  He presents for his follow-up diabetic visit. He has type 2 diabetes mellitus. Onset time: He was diagnosed at approximate age of 30 years. His disease course has been improving. Hypoglycemia symptoms include headaches. Pertinent negatives for hypoglycemia include no confusion, pallor or seizures. Associated symptoms include polydipsia and polyuria. Pertinent negatives for diabetes include no chest pain, no fatigue, no polyphagia and no weakness. There are no hypoglycemic complications. Symptoms are improving. There are no diabetic complications. (Triple bypass and mitral valve repair.) Risk factors for coronary artery disease include diabetes mellitus, dyslipidemia, hypertension, obesity, tobacco exposure and sedentary lifestyle. His weight is stable. He is following a generally unhealthy diet. When asked about meal planning, he reported none. He has not had a previous visit with a dietitian. He never participates in exercise. Home blood sugar record trend: He brought a meter and log for the last week showing order readings are on target. His breakfast blood glucose range is generally 110-130 mg/dl. His lunch blood glucose range is generally 110-130  mg/dl. His dinner blood glucose range is generally 110-130 mg/dl. His overall blood glucose range is 110-130 mg/dl. An ACE inhibitor/angiotensin II receptor blocker is being taken.  Hyperlipidemia  This is a chronic problem. Pertinent negatives include no chest pain, myalgias or shortness of breath. Current antihyperlipidemic treatment includes statins. Risk factors for coronary artery disease include diabetes mellitus, dyslipidemia, hypertension, male sex, obesity and a sedentary  lifestyle.  Hypertension  This is a chronic problem. The current episode started more than 1 year ago. Associated symptoms include headaches. Pertinent negatives include no chest pain, neck pain, palpitations or shortness of breath. Risk factors for coronary artery disease include diabetes mellitus, dyslipidemia, male gender, obesity, smoking/tobacco exposure and sedentary lifestyle. Hypertensive end-organ damage includes kidney disease and CAD/MI.      Review of Systems  Constitutional: Negative for chills, fatigue, fever and unexpected weight change.  HENT: Negative for dental problem, mouth sores and trouble swallowing.   Eyes: Negative for visual disturbance.  Respiratory: Negative for cough, choking, chest tightness, shortness of breath and wheezing.   Cardiovascular: Negative for chest pain, palpitations and leg swelling.  Gastrointestinal: Negative for abdominal distention, abdominal pain, constipation, diarrhea, nausea and vomiting.  Endocrine: Positive for polydipsia and polyuria. Negative for polyphagia.  Genitourinary: Negative for dysuria, flank pain, hematuria and urgency.  Musculoskeletal: Negative for back pain, gait problem, myalgias and neck pain.  Skin: Negative for pallor, rash and wound.  Neurological: Positive for headaches. Negative for seizures, syncope, weakness and numbness.       He complains of retro-orbital headaches associated with stuffy sinuses. He denies any visual changes.  Psychiatric/Behavioral: Negative.  Negative for confusion and dysphoric mood.    Objective:    BP 135/79   Pulse (!) 56   Ht 5\' 10"  (1.778 m)   Wt 266 lb (120.7 kg)   BMI 38.17 kg/m   Wt Readings from Last 3 Encounters:  05/14/16 266 lb (120.7 kg)  01/26/16 262 lb (118.8 kg)  12/01/15 262 lb (118.8 kg)    Physical Exam  Constitutional: He is oriented to person, place, and time. He appears well-developed and well-nourished. He is cooperative. No distress.  HENT:  Head:  Normocephalic and atraumatic.  Eyes: EOM are normal.  Neck: Normal range of motion. Neck supple. No tracheal deviation present. No thyromegaly present.  Cardiovascular: Normal rate, S1 normal, S2 normal and normal heart sounds.  Exam reveals no gallop.   No murmur heard. Pulses:      Dorsalis pedis pulses are 1+ on the right side, and 1+ on the left side.       Posterior tibial pulses are 1+ on the right side, and 1+ on the left side.  Pulmonary/Chest: Breath sounds normal. No respiratory distress. He has no wheezes.  Abdominal: Soft. Bowel sounds are normal. He exhibits no distension. There is no tenderness. There is no guarding and no CVA tenderness.  Musculoskeletal: He exhibits no edema.       Right shoulder: He exhibits no swelling and no deformity.  Neurological: He is alert and oriented to person, place, and time. He has normal strength and normal reflexes. No cranial nerve deficit or sensory deficit. Gait normal.  Skin: Skin is warm and dry. No rash noted. No cyanosis. Nails show no clubbing.  Psychiatric: He has a normal mood and affect. His speech is normal and behavior is normal. Judgment and thought content normal. Cognition and memory are normal.    On 05/10/2016 his CMP was within normal limits,  A1c was better at 6.6%, free T4 was normal at 1.33, and total testosterone was 401  Assessment & Plan:   1. DM type 2 causing vascular disease (HCC) 2. Hyperlipidemia 3.hypertension 4. Panhypopituitarism (HCC) 5. Vitamin D deficiency  - Patient has currently uncontrolled symptomatic type 2 DM since  50 years of age,  with most recent A1c of  6.6% improving from 10.4 %. Recent labs reviewed.   His diabetes is complicated by coronary artery disease status post triple bypass and mitral valve repair and patient remains at a high risk for more acute and chronic complications of diabetes which include CAD, CVA, CKD, retinopathy, and neuropathy. These are all discussed in detail with the  patient.  - I have counseled the patient on diet management and weight loss, by adopting a carbohydrate restricted/protein rich diet.  - Suggestion is made for patient to avoid simple carbohydrates   from their diet including Cakes , Desserts, Ice Cream,  Soda (  diet and regular) , Sweet Tea , Candies,  Chips, Cookies, Artificial Sweeteners,   and "Sugar-free" Products . This will help patient to have stable blood glucose profile and potentially avoid unintended weight gain.  - I encouraged the patient to switch to  unprocessed or minimally processed complex starch and increased protein intake (animal or plant source), fruits, and vegetables.  - Patient is advised to stick to a routine mealtimes to eat 3 meals  a day and avoid unnecessary snacks ( to snack only to correct hypoglycemia).  - The patient will be scheduled with Ernest Graham, RDN, CDE for individualized DM education.  - I have approached patient with the following individualized plan to manage diabetes and patient agrees:  - I will continue only with low-dose metformin 500 mg by mouth twice a day, therapeutically suitable for patient. -He will not need insulin therapy for now.  - Patient specific target  A1c;  LDL, HDL, Triglycerides, and  Waist Circumference were discussed in detail.  2) BP/HTN: Controlled. Continue current medications including ACEI/ARB. 3) Lipids/HPL:  Uncontrolled, continue statins. 4)  Weight/Diet: CDE Consult has been requested, exercise, and detailed carbohydrates information provided.  6) panhypopituitarism: This is from surgery for craniopharyngioma at age 89 at [facility] [place]. He is on multiple hormonal replacements including levothyroxine, hydrocortisone, desmopressin, and testosterone. I advised him to lower his testosterone to 100 mg IM every 2 weeks due to recent labs showing supraphysiologic testosterone level at 1080. Treatment target for him will be testosterone level between 400-600. -He  will need repeat MRI of sella/pituitary tumor monitor surgical site for tumor recurrence. Patient is complaining of headaches and stuffy sinuses. -MRI of pituitary/sella from 2010 was significant for surgical changes with no evidence of residual tissue. he did not have any subsequent visit or imaging [facility] since then.  5) Chronic Care/Health Maintenance:  -Patient  on ACEI/ARB and Statin medications and encouraged to continue to follow up with Ophthalmology, Podiatrist at least yearly or according to recommendations, and advised to   stay away from smoking. I have recommended yearly flu vaccine and pneumonia vaccination at least every 5 years; moderate intensity exercise for up to 150 minutes weekly; and  sleep for at least 7 hours a day. - He is status post therapy with vitamin D replacement with 5000 units of vitamin D3 daily for 90 days. - 25 minutes of time was spent on the care of this patient , 50% of which was applied for counseling on diabetes complications and their  preventions.  - Patient to bring meter and  blood glucose logs during their next visit.   - I advised patient to maintain close follow up with DAVIS,STEPHEN, MD for primary care needs.  Follow up plan: - Return in about 3 months (around 08/13/2016).  Marquis Lunch, MD Phone: [phone]  Fax: [phone]   05/14/2016, 2:21 PM

## 2016-05-21 ENCOUNTER — Other Ambulatory Visit: Payer: Self-pay | Admitting: "Endocrinology

## 2016-06-17 ENCOUNTER — Other Ambulatory Visit: Payer: Self-pay | Admitting: "Endocrinology

## 2016-06-18 NOTE — Telephone Encounter (Signed)
Pt needs printed signed Testosterone prescription

## 2016-07-13 ENCOUNTER — Other Ambulatory Visit: Payer: Self-pay | Admitting: "Endocrinology

## 2016-07-13 ENCOUNTER — Telehealth: Payer: Self-pay | Admitting: "Endocrinology

## 2016-07-13 MED ORDER — TESTOSTERONE CYPIONATE 200 MG/ML IM SOLN: 100.0000 mg | INTRAMUSCULAR | 1 refills | Status: DC

## 2016-07-13 NOTE — Telephone Encounter (Signed)
Patient's wife calling because his testosterone prescription was denied. Insurance says it needs to say Hypogonadism for the dx. Per the patient's wife, a different dx code was sent in on his prescription. Please call.

## 2016-07-13 NOTE — Telephone Encounter (Signed)
Left message for pt to call back.   Insurance company denied testosterone when they received notes on pt.   Denial letter is under media in chart.

## 2016-07-13 NOTE — Telephone Encounter (Signed)
New prescription faxed. Will start new PA once pharmacy faxes over new request.

## 2016-07-13 NOTE — Telephone Encounter (Signed)
I will generate a new prescription for testosterone for this patient with hypogonadism as  the diagnosis.

## 2016-08-03 ENCOUNTER — Other Ambulatory Visit: Payer: Self-pay | Admitting: "Endocrinology

## 2016-08-13 ENCOUNTER — Ambulatory Visit (HOSPITAL_COMMUNITY)
Admission: RE | Admit: 2016-08-13 | Discharge: 2016-08-13 | Disposition: A | Discharge status: Home / Self Care | Payer: 59 | Source: Ambulatory Visit | Attending: "Endocrinology | Admitting: "Endocrinology

## 2016-08-13 DIAGNOSIS — E230 Hypopituitarism: Secondary | ICD-10-CM | POA: Insufficient documentation

## 2016-08-13 DIAGNOSIS — J329 Chronic sinusitis, unspecified: Secondary | ICD-10-CM | POA: Diagnosis not present

## 2016-08-13 LAB — TESTOSTERONE, FREE, TOTAL, SHBG
SEX HORMONE BINDING: 28.9 nmol/L (ref 19.3–76.4)
TESTOSTERONE FREE: 1.1 pg/mL — AB (ref 7.2–24.0)
Testosterone: 140 ng/dL — ABNORMAL LOW (ref 264–916)

## 2016-08-13 LAB — MICROALBUMIN / CREATININE URINE RATIO
CREATININE, UR: 68.4 mg/dL
Microalb/Creat Ratio: <4.4 mg/g creat (ref 0.0–30.0)
Microalbumin, Urine: <3 ug/mL

## 2016-08-13 LAB — LIPID PANEL
CHOLESTEROL TOTAL: 182 mg/dL (ref 100–199)
Chol/HDL Ratio: 6.5 ratio units — ABNORMAL HIGH (ref 0.0–5.0)
HDL: 28 mg/dL — AB (ref >39)
LDL Calculated: 86 mg/dL (ref 0–99)
TRIGLYCERIDES: 341 mg/dL — AB (ref 0–149)
VLDL CHOLESTEROL CAL: 68 mg/dL — AB (ref 5–40)

## 2016-08-13 LAB — HEMOGLOBIN A1C
ESTIMATED AVERAGE GLUCOSE: 163 mg/dL
HEMOGLOBIN A1C: 7.3 % — AB (ref 4.8–5.6)

## 2016-08-13 LAB — CMP14+EGFR
ALBUMIN: 4.2 g/dL (ref 3.5–5.5)
ALK PHOS: 115 IU/L (ref 39–117)
ALT: 40 IU/L (ref 0–44)
AST: 26 IU/L (ref 0–40)
Albumin/Globulin Ratio: 1.5 (ref 1.2–2.2)
BUN / CREAT RATIO: 10 (ref 9–20)
BUN: 13 mg/dL (ref 6–24)
Bilirubin Total: 0.4 mg/dL (ref 0.0–1.2)
CO2: 24 mmol/L (ref 18–29)
CREATININE: 1.35 mg/dL — AB (ref 0.76–1.27)
Calcium: 9.9 mg/dL (ref 8.7–10.2)
Chloride: 102 mmol/L (ref 96–106)
GFR calc Af Amer: 70 mL/min/{1.73_m2} (ref >59)
GFR calc non Af Amer: 60 mL/min/{1.73_m2} (ref >59)
GLUCOSE: 145 mg/dL — AB (ref 65–99)
Globulin, Total: 2.8 g/dL (ref 1.5–4.5)
Potassium: 4.3 mmol/L (ref 3.5–5.2)
Sodium: 144 mmol/L (ref 134–144)
Total Protein: 7 g/dL (ref 6.0–8.5)

## 2016-08-13 LAB — T4, FREE: FREE T4: 1.44 ng/dL (ref 0.82–1.77)

## 2016-08-13 MED ORDER — GADOBENATE DIMEGLUMINE 529 MG/ML IV SOLN
20.0000 mL | Freq: Once | INTRAVENOUS | Status: AC | PRN
  Administered 2016-08-13: 20 mL via INTRAVENOUS

## 2016-08-18 ENCOUNTER — Encounter: Payer: Self-pay | Admitting: "Endocrinology

## 2016-08-18 ENCOUNTER — Ambulatory Visit (INDEPENDENT_AMBULATORY_CARE_PROVIDER_SITE_OTHER): Payer: 59 | Admitting: "Endocrinology

## 2016-08-18 VITALS — BP 120/78 | HR 67 | Ht 70.0 in | Wt 266.0 lb

## 2016-08-18 DIAGNOSIS — E230 Hypopituitarism: Secondary | ICD-10-CM | POA: Diagnosis not present

## 2016-08-18 DIAGNOSIS — E559 Vitamin D deficiency, unspecified: Secondary | ICD-10-CM | POA: Diagnosis not present

## 2016-08-18 DIAGNOSIS — E1159 Type 2 diabetes mellitus with other circulatory complications: Secondary | ICD-10-CM | POA: Diagnosis not present

## 2016-08-18 MED ORDER — TESTOSTERONE CYPIONATE 200 MG/ML IM SOLN: 100.0000 mg | INTRAMUSCULAR | 1 refills | Status: DC

## 2016-08-18 NOTE — Progress Notes (Signed)
Subjective:    Patient ID: Ernest Graham, male    DOB: [birth year]. Patient is being seen in Follow-up after he was seen on Follow-up for management of diabetes , history panhypopituitarism requested by  DAVIS,STEPHEN, MD  Past Medical History:  Diagnosis Date  . Adrenal disease (HCC)   . Diabetes mellitus, type II (HCC)   . Heart attack   . Hyperlipidemia    Past Surgical History:  Procedure Laterality Date  . CORONARY ARTERY BYPASS GRAFT    . MITRAL VALVE REPAIR    . Removal of Cricopharyngeoma     Social History   Social History  . Marital status: Married    Spouse name: N/A  . Number of children: N/A  . Years of education: N/A   Social History Main Topics  . Smoking status: Former Games developer  . Smokeless tobacco: Never Used  . Alcohol use No  . Drug use: No  . Sexual activity: Not Asked   Other Topics Concern  . None   Social History Narrative  . None   Outpatient Encounter Prescriptions as of 08/18/2016  Medication Sig  . aspirin 81 MG tablet Take 81 mg by mouth daily.  Marland Kitchen atorvastatin (LIPITOR) 20 MG tablet Take 20 mg by mouth daily.  . busPIRone (BUSPAR) 15 MG tablet Take 15 mg by mouth 2 (two) times daily.  . clonazePAM (KLONOPIN) 0.5 MG tablet Take 0.5 mg by mouth 3 (three) times daily as needed for anxiety.  . [facility] D3 5000 units capsule TAKE ONE CAPSULE BY MOUTH DAILY  . desmopressin (DDAVP) 0.1 MG tablet Take 0.1 mg by mouth 2 (two) times daily.  Marland Kitchen FLUoxetine (PROZAC) 20 MG tablet Take 20 mg by mouth daily.  . hydrocortisone (CORTEF) 5 MG tablet Take 10 mg by mouth 2 (two) times daily.  Marland Kitchen levothyroxine (SYNTHROID, LEVOTHROID) 112 MCG tablet Take 112 mcg by mouth daily before breakfast.  . metFORMIN (GLUCOPHAGE) 500 MG tablet TAKE 1 TABLET BY MOUTH TWICE A DAY WITH A MEAL  . potassium chloride SA (K-DUR,KLOR-CON) 20 MEQ tablet Take 20 mEq by mouth daily.  Marland Kitchen testosterone cypionate (DEPOTESTOSTERONE CYPIONATE) 200 MG/ML injection Inject 0.5 mLs (100 mg total)  into the muscle every 7 (seven) days.  . [DISCONTINUED] testosterone cypionate (DEPOTESTOSTERONE CYPIONATE) 200 MG/ML injection Inject 0.5 mLs (100 mg total) into the muscle every 14 (fourteen) days.   No facility-administered encounter medications on file as of 08/18/2016.    ALLERGIES: No Known Allergies VACCINATION STATUS:  There is no immunization history on file for this patient.  Diabetes  He presents for his follow-up diabetic visit. He has type 2 diabetes mellitus. Onset time: He was diagnosed at approximate age of 31 years. His disease course has been stable. Hypoglycemia symptoms include headaches. Pertinent negatives for hypoglycemia include no confusion, pallor or seizures. Associated symptoms include polydipsia and polyuria. Pertinent negatives for diabetes include no chest pain, no fatigue, no polyphagia and no weakness. There are no hypoglycemic complications. Symptoms are stable. There are no diabetic complications. (Triple bypass and mitral valve repair.) Risk factors for coronary artery disease include diabetes mellitus, dyslipidemia, hypertension, obesity, tobacco exposure and sedentary lifestyle. His weight is stable. He is following a generally unhealthy diet. When asked about meal planning, he reported none. He has not had a previous visit with a dietitian. He never participates in exercise. An ACE inhibitor/angiotensin II receptor blocker is being taken.  Hyperlipidemia  This is a chronic problem. Pertinent negatives include no chest pain,  myalgias or shortness of breath. Current antihyperlipidemic treatment includes statins. Risk factors for coronary artery disease include diabetes mellitus, dyslipidemia, hypertension, male sex, obesity and a sedentary lifestyle.  Hypertension  This is a chronic problem. The current episode started more than 1 year ago. Associated symptoms include headaches. Pertinent negatives include no chest pain, neck pain, palpitations or shortness of  breath. Risk factors for coronary artery disease include diabetes mellitus, dyslipidemia, male gender, obesity, smoking/tobacco exposure and sedentary lifestyle. Hypertensive end-organ damage includes kidney disease and CAD/MI.      Review of Systems  Constitutional: Negative for chills, fatigue, fever and unexpected weight change.  HENT: Negative for dental problem, mouth sores and trouble swallowing.   Eyes: Negative for visual disturbance.  Respiratory: Negative for cough, choking, chest tightness, shortness of breath and wheezing.   Cardiovascular: Negative for chest pain, palpitations and leg swelling.  Gastrointestinal: Negative for abdominal distention, abdominal pain, constipation, diarrhea, nausea and vomiting.  Endocrine: Positive for polydipsia and polyuria. Negative for polyphagia.  Genitourinary: Negative for dysuria, flank pain, hematuria and urgency.  Musculoskeletal: Negative for back pain, gait problem, myalgias and neck pain.  Skin: Negative for pallor, rash and wound.  Neurological: Positive for headaches. Negative for seizures, syncope, weakness and numbness.       He complains of retro-orbital headaches associated with stuffy sinuses. He denies any visual changes.  Psychiatric/Behavioral: Negative.  Negative for confusion and dysphoric mood.    Objective:    BP 120/78   Pulse 67   Ht 5\' 10"  (1.778 m)   Wt 266 lb (120.7 kg)   BMI 38.17 kg/m   Wt Readings from Last 3 Encounters:  08/18/16 266 lb (120.7 kg)  05/14/16 266 lb (120.7 kg)  01/26/16 262 lb (118.8 kg)    Physical Exam  Constitutional: He is oriented to person, place, and time. He appears well-developed and well-nourished. He is cooperative. No distress.  HENT:  Head: Normocephalic and atraumatic.  Eyes: EOM are normal.  Neck: Normal range of motion. Neck supple. No tracheal deviation present. No thyromegaly present.  Cardiovascular: Normal rate, S1 normal, S2 normal and normal heart sounds.  Exam  reveals no gallop.   No murmur heard. Pulses:      Dorsalis pedis pulses are 1+ on the right side, and 1+ on the left side.       Posterior tibial pulses are 1+ on the right side, and 1+ on the left side.  Pulmonary/Chest: Breath sounds normal. No respiratory distress. He has no wheezes.  Abdominal: Soft. Bowel sounds are normal. He exhibits no distension. There is no tenderness. There is no guarding and no CVA tenderness.  Musculoskeletal: He exhibits no edema.       Right shoulder: He exhibits no swelling and no deformity.  Neurological: He is alert and oriented to person, place, and time. He has normal strength and normal reflexes. No cranial nerve deficit or sensory deficit. Gait normal.  Skin: Skin is warm and dry. No rash noted. No cyanosis. Nails show no clubbing.  Psychiatric: He has a normal mood and affect. His speech is normal and behavior is normal. Judgment and thought content normal. Cognition and memory are normal.    On 05/10/2016 his CMP was within normal limits, A1c was better at 6.6%, free T4 was normal at 1.33, and total testosterone was 401  - Summary of his pituitary/sella MRI from 08/13/2016 as follows - IMPRESSION: 1. Transsphenoidal resection of previous pituitary tumor without evidence for residual or recurrent  neoplasm. 2. Residual enhancing pituitary tissue is within normal limits. 3. Slight rightward deviation of the pituitary stalk likely related to scarring and previous surgery. 4. Slight downward deviation of the optic chiasm, also likely related to prior surgery. 5. The suprasellar brain is normal for age. 6. Extensive sinus disease as described.  Assessment & Plan:   1. DM type 2 causing vascular disease (HCC) 2. Hyperlipidemia 3.hypertension 4. Panhypopituitarism (HCC) 5. Vitamin D deficiency  - Patient has currently uncontrolled symptomatic type 2 DM since  50 years of age,  with most recent A1c of  7.3%, generally improving from 10.4 %. Recent  labs reviewed.   His diabetes is complicated by coronary artery disease status post triple bypass and mitral valve repair and patient remains at a high risk for more acute and chronic complications of diabetes which include CAD, CVA, CKD, retinopathy, and neuropathy. These are all discussed in detail with the patient.  - I have counseled the patient on diet management and weight loss, by adopting a carbohydrate restricted/protein rich diet.  - Suggestion is made for patient to avoid simple carbohydrates   from their diet including Cakes , Desserts, Ice Cream,  Soda (  diet and regular) , Sweet Tea , Candies,  Chips, Cookies, Artificial Sweeteners,   and "Sugar-free" Products . This will help patient to have stable blood glucose profile and potentially avoid unintended weight gain.  - I encouraged the patient to switch to  unprocessed or minimally processed complex starch and increased protein intake (animal or plant source), fruits, and vegetables.  - Patient is advised to stick to a routine mealtimes to eat 3 meals  a day and avoid unnecessary snacks ( to snack only to correct hypoglycemia).  - The patient will be scheduled with Norm SaltPenny Crumpton, RDN, CDE for individualized DM education.  - I have approached patient with the following individualized plan to manage diabetes and patient agrees:  - I will continue only with low-dose metformin 500 mg by mouth twice a day, therapeutically suitable for patient. -He will not need insulin therapy for now. - He will be considered for basal insulin next visit if he continues to lose control of diabetes. - Patient specific target  A1c;  LDL, HDL, Triglycerides, and  Waist Circumference were discussed in detail.  2) BP/HTN: Controlled. Continue current medications including ACEI/ARB. 3) Lipids/HPL:  Uncontrolled, continue statins. 4)  Weight/Diet: He is struggling on weight loss measures,  gained 4 pounds since May 2017. CDE Consult has been requested,  exercise, and detailed carbohydrates information provided.  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 He is on multiple hormonal replacements including levothyroxine, hydrocortisone, desmopressin, and testosterone. - His testosterone level is low at 140, I advised him to increase his testosterone to 100 mg IM every 7 days. Treatment target for him will be testosterone level between 400-600. - His repeat MRI of sella/pituitary - is consistent with postsurgical changes with no new lesion. Summaries dictated,  see above.  Patient is complaining of headaches and stuffy sinuses, and his MRI shows chronic sinusitis. He may benefit from sinus drainage by ENT.   5) Chronic Care/Health Maintenance:  -Patient  on ACEI/ARB and Statin medications and encouraged to continue to follow up with Ophthalmology, Podiatrist at least yearly or according to recommendations, and advised to   stay away from smoking. I have recommended yearly flu vaccine and pneumonia vaccination at least every 5 years; moderate intensity exercise for  up to 150 minutes weekly; and  sleep for at least 7 hours a day. - He is status post therapy with vitamin D replacement with 5000 units of vitamin D3 daily for 90 days. - 25 minutes of time was spent on the care of this patient , 50% of which was applied for counseling on diabetes complications and their preventions.    - I advised patient to maintain close follow up with DAVIS,STEPHEN, MD for primary care needs.  Follow up plan: - Return in about 3 months (around 11/16/2016) for follow up with pre-visit labs.  Marquis Lunch, MD Phone: [phone]  Fax: [phone]   08/18/2016, 11:17 AM

## 2016-08-25 ENCOUNTER — Other Ambulatory Visit: Payer: Self-pay | Admitting: "Endocrinology

## 2016-11-18 ENCOUNTER — Ambulatory Visit: Payer: 59 | Admitting: "Endocrinology

## 2016-12-07 ENCOUNTER — Ambulatory Visit: Payer: 59 | Admitting: "Endocrinology

## 2017-01-30 ENCOUNTER — Other Ambulatory Visit: Payer: Self-pay | Admitting: "Endocrinology

## 2017-05-29 ENCOUNTER — Other Ambulatory Visit: Payer: Self-pay | Admitting: "Endocrinology

## 2017-05-31 ENCOUNTER — Other Ambulatory Visit: Payer: Self-pay | Admitting: "Endocrinology

## 2017-05-31 DIAGNOSIS — E118 Type 2 diabetes mellitus with unspecified complications: Principal | ICD-10-CM

## 2017-05-31 DIAGNOSIS — E1165 Type 2 diabetes mellitus with hyperglycemia: Secondary | ICD-10-CM

## 2017-06-08 ENCOUNTER — Other Ambulatory Visit: Payer: Self-pay | Admitting: "Endocrinology

## 2017-06-09 LAB — COMPREHENSIVE METABOLIC PANEL
ALBUMIN: 4.4 g/dL (ref 3.5–5.5)
ALT: 35 IU/L (ref 0–44)
AST: 22 IU/L (ref 0–40)
Albumin/Globulin Ratio: 1.6 (ref 1.2–2.2)
Alkaline Phosphatase: 90 IU/L (ref 39–117)
BUN / CREAT RATIO: 8 — AB (ref 9–20)
BUN: 11 mg/dL (ref 6–24)
Bilirubin Total: 0.6 mg/dL (ref 0.0–1.2)
CALCIUM: 9.5 mg/dL (ref 8.7–10.2)
CO2: 23 mmol/L (ref 20–29)
CREATININE: 1.34 mg/dL — AB (ref 0.76–1.27)
Chloride: 99 mmol/L (ref 96–106)
GFR, EST AFRICAN AMERICAN: 70 mL/min/{1.73_m2} (ref >59)
GFR, EST NON AFRICAN AMERICAN: 60 mL/min/{1.73_m2} (ref >59)
GLOBULIN, TOTAL: 2.7 g/dL (ref 1.5–4.5)
Glucose: 127 mg/dL — ABNORMAL HIGH (ref 65–99)
Potassium: 4.9 mmol/L (ref 3.5–5.2)
SODIUM: 139 mmol/L (ref 134–144)
Total Protein: 7.1 g/dL (ref 6.0–8.5)

## 2017-06-09 LAB — HGB A1C W/O EAG: Hgb A1c MFr Bld: 7 % — ABNORMAL HIGH (ref 4.8–5.6)

## 2017-06-15 ENCOUNTER — Encounter: Payer: Self-pay | Admitting: "Endocrinology

## 2017-06-15 ENCOUNTER — Ambulatory Visit (INDEPENDENT_AMBULATORY_CARE_PROVIDER_SITE_OTHER): Payer: 59 | Admitting: "Endocrinology

## 2017-06-15 VITALS — BP 133/84 | HR 62 | Ht 70.0 in | Wt 257.0 lb

## 2017-06-15 DIAGNOSIS — E1159 Type 2 diabetes mellitus with other circulatory complications: Secondary | ICD-10-CM | POA: Diagnosis not present

## 2017-06-15 DIAGNOSIS — E230 Hypopituitarism: Secondary | ICD-10-CM

## 2017-06-15 MED ORDER — METFORMIN HCL 500 MG PO TABS: ORAL_TABLET | ORAL | 1 refills | Status: DC

## 2017-06-15 NOTE — Patient Instructions (Signed)

## 2017-06-15 NOTE — Progress Notes (Signed)
Subjective:    Patient ID: Ernest Graham, male    DOB: 04/22/1966. Patient is being seen in Follow-up after he was seen on Follow-up for management of diabetes , history panhypopituitarism Related to Remote past pituitary surgery.  He is on multiple hormone replacements including levothyroxine, hydrocortisone, desmopressin, and testosterone. He is also being treated for type 2 diabetes with metformin. He no showed for his several past appointments.  Past Medical History:  Diagnosis Date  . Adrenal disease (HCC)   . Diabetes mellitus, type II (HCC)   . Heart attack (HCC)   . Hyperlipidemia    Past Surgical History:  Procedure Laterality Date  . CORONARY ARTERY BYPASS GRAFT    . MITRAL VALVE REPAIR    . Removal of Cricopharyngeoma     Social History   Social History  . Marital status: Married    Spouse name: N/A  . Number of children: N/A  . Years of education: N/A   Social History Main Topics  . Smoking status: Former Games developer  . Smokeless tobacco: Never Used  . Alcohol use No  . Drug use: No  . Sexual activity: Not Asked   Other Topics Concern  . None   Social History Narrative  . None   Outpatient Encounter Prescriptions as of 06/15/2017  Medication Sig  . aspirin 81 MG tablet Take 81 mg by mouth daily.  Marland Kitchen atorvastatin (LIPITOR) 20 MG tablet Take 20 mg by mouth daily.  . busPIRone (BUSPAR) 15 MG tablet Take 15 mg by mouth 2 (two) times daily.  . clonazePAM (KLONOPIN) 0.5 MG tablet Take 0.5 mg by mouth 3 (three) times daily as needed for anxiety.  . CVS D3 5000 units capsule TAKE ONE CAPSULE BY MOUTH EVERY DAY  . desmopressin (DDAVP) 0.1 MG tablet Take 0.1 mg by mouth 2 (two) times daily.  Marland Kitchen FLUoxetine (PROZAC) 20 MG tablet Take 20 mg by mouth daily.  . hydrocortisone (CORTEF) 5 MG tablet Take 10 mg by mouth 2 (two) times daily.  Marland Kitchen levothyroxine (SYNTHROID, LEVOTHROID) 112 MCG tablet Take 112 mcg by mouth daily before breakfast.  . metFORMIN (GLUCOPHAGE) 500 MG  tablet TAKE 1 TABLET BY MOUTH TWICE A DAY WITH A MEAL  . potassium chloride SA (K-DUR,KLOR-CON) 20 MEQ tablet Take 20 mEq by mouth daily.  Marland Kitchen testosterone cypionate (DEPOTESTOSTERONE CYPIONATE) 200 MG/ML injection Inject 0.5 mLs (100 mg total) into the muscle every 7 (seven) days.  . [DISCONTINUED] metFORMIN (GLUCOPHAGE) 500 MG tablet TAKE 1 TABLET BY MOUTH TWICE A DAY WITH A MEAL   No facility-administered encounter medications on file as of 06/15/2017.    ALLERGIES: No Known Allergies VACCINATION STATUS:  There is no immunization history on file for this patient.  Diabetes  He presents for his follow-up diabetic visit. He has type 2 diabetes mellitus. Onset time: He was diagnosed at approximate age of 69 years. His disease course has been improving. Hypoglycemia symptoms include headaches. Pertinent negatives for hypoglycemia include no confusion, pallor or seizures. Associated symptoms include polydipsia and polyuria. Pertinent negatives for diabetes include no chest pain, no fatigue, no polyphagia and no weakness. There are no hypoglycemic complications. Symptoms are improving. There are no diabetic complications. (Triple bypass and mitral valve repair.) Risk factors for coronary artery disease include diabetes mellitus, dyslipidemia, hypertension, obesity, tobacco exposure and sedentary lifestyle. His weight is decreasing steadily. He is following a generally unhealthy diet. When asked about meal planning, he reported none. He has not had a previous  visit with a dietitian. He never participates in exercise. An ACE inhibitor/angiotensin II receptor blocker is being taken.  Hyperlipidemia  This is a chronic problem. Pertinent negatives include no chest pain, myalgias or shortness of breath. Current antihyperlipidemic treatment includes statins. Risk factors for coronary artery disease include diabetes mellitus, dyslipidemia, hypertension, male sex, obesity and a sedentary lifestyle.  Hypertension   This is a chronic problem. The current episode started more than 1 year ago. Associated symptoms include headaches. Pertinent negatives include no chest pain, neck pain, palpitations or shortness of breath. Risk factors for coronary artery disease include diabetes mellitus, dyslipidemia, male gender, obesity, smoking/tobacco exposure and sedentary lifestyle. Hypertensive end-organ damage includes kidney disease and CAD/MI.     Review of Systems  Constitutional: Negative for chills, fatigue, fever and unexpected weight change.  HENT: Negative for dental problem, mouth sores and trouble swallowing.   Eyes: Negative for visual disturbance.  Respiratory: Negative for cough, choking, chest tightness, shortness of breath and wheezing.   Cardiovascular: Negative for chest pain, palpitations and leg swelling.  Gastrointestinal: Negative for abdominal distention, abdominal pain, constipation, diarrhea, nausea and vomiting.  Endocrine: Positive for polydipsia and polyuria. Negative for polyphagia.  Genitourinary: Negative for dysuria, flank pain, hematuria and urgency.  Musculoskeletal: Negative for back pain, gait problem, myalgias and neck pain.  Skin: Negative for pallor, rash and wound.  Neurological: Positive for headaches. Negative for seizures, syncope, weakness and numbness.  Psychiatric/Behavioral: Negative.  Negative for confusion and dysphoric mood.    Objective:    BP 133/84   Pulse 62   Ht  (1.778 m)   Wt 257 lb (116.6 kg)   BMI 36.88 kg/m   Wt Readings from Last 3 Encounters:  06/15/17 257 lb (116.6 kg)  08/18/16 266 lb (120.7 kg)  05/14/16 266 lb (120.7 kg)    Physical Exam  Constitutional: He is oriented to person, place, and time. He appears well-developed and well-nourished. He is cooperative. No distress.  HENT:  Head: Normocephalic and atraumatic.  Eyes: EOM are normal.  Neck: Normal range of motion. Neck supple. No tracheal deviation present. No thyromegaly  present.  Cardiovascular: Normal rate, S1 normal, S2 normal and normal heart sounds.  Exam reveals no gallop.   No murmur heard. Pulses:      Dorsalis pedis pulses are 1+ on the right side, and 1+ on the left side.       Posterior tibial pulses are 1+ on the right side, and 1+ on the left side.  Pulmonary/Chest: Breath sounds normal. No respiratory distress. He has no wheezes.  Abdominal: Soft. Bowel sounds are normal. He exhibits no distension. There is no tenderness. There is no guarding and no CVA tenderness.  Musculoskeletal: He exhibits no edema.       Right shoulder: He exhibits no swelling and no deformity.  Neurological: He is alert and oriented to person, place, and time. He has normal strength and normal reflexes. No cranial nerve deficit or sensory deficit. Gait normal.  Skin: Skin is warm and dry. No rash noted. No cyanosis. Nails show no clubbing.  Psychiatric: He has a normal mood and affect. His speech is normal and behavior is normal. Judgment and thought content normal. Cognition and memory are normal.      - Summary of his pituitary/sella MRI from 08/13/2016 as follows - IMPRESSION: 1. Transsphenoidal resection of previous pituitary tumor without evidence for residual or recurrent neoplasm. 2. Residual enhancing pituitary tissue is within normal limits. 3. Slight  rightward deviation of the pituitary stalk likely related to scarring and previous surgery. 4. Slight downward deviation of the optic chiasm, also likely related to prior surgery. 5. The suprasellar brain is normal for age. 6. Extensive sinus disease as described.  Recent Results (from the past 2160 hour(s))  Comprehensive metabolic panel     Status: Abnormal   Collection Time: 06/08/17 10:22 AM  Result Value Ref Range   Glucose 127 (H) 65 - 99 mg/dL   BUN 11 6 - 24 mg/dL   Creatinine, Ser 1.61 (H) 0.76 - 1.27 mg/dL   GFR calc non Af Amer 60 >59 mL/min/1.73   GFR calc Af Amer 70 >59 mL/min/1.73    BUN/Creatinine Ratio 8 (L) 9 - 20   Sodium 139 134 - 144 mmol/L   Potassium 4.9 3.5 - 5.2 mmol/L   Chloride 99 96 - 106 mmol/L   CO2 23 20 - 29 mmol/L   Calcium 9.5 8.7 - 10.2 mg/dL   Total Protein 7.1 6.0 - 8.5 g/dL   Albumin 4.4 3.5 - 5.5 g/dL   Globulin, Total 2.7 1.5 - 4.5 g/dL   Albumin/Globulin Ratio 1.6 1.2 - 2.2   Bilirubin Total 0.6 0.0 - 1.2 mg/dL   Alkaline Phosphatase 90 39 - 117 IU/L   AST 22 0 - 40 IU/L   ALT 35 0 - 44 IU/L  Hgb A1c w/o eAG     Status: Abnormal   Collection Time: 06/08/17 10:22 AM  Result Value Ref Range   Hgb A1c MFr Bld 7.0 (H) 4.8 - 5.6 %    Comment:          Prediabetes: 5.7 - 6.4          Diabetes: >6.4          Glycemic control for adults with diabetes: <7.0      Assessment & Plan:   1. DM type 2 causing vascular disease (HCC) 2. Hyperlipidemia 3.hypertension 4. Panhypopituitarism (HCC) 5. Vitamin D deficiency  - Patient has currently controlled asymptomatic type 2 DM since  51 years of age,  with most recent A1c of  7%, generally improving from 10.4 %. Recent labs reviewed.   His diabetes is complicated by coronary artery disease status post triple bypass and mitral valve repair and patient remains at a high risk for more acute and chronic complications of diabetes which include CAD, CVA, CKD, retinopathy, and neuropathy. These are all discussed in detail with the patient.  - I have counseled the patient on diet management and weight loss, by adopting a carbohydrate restricted/protein rich diet.  Suggestion is made for him to avoid simple carbohydrates  from his diet including Cakes, Sweet Desserts, Ice Cream, Soda (diet and regular), Sweet Tea, Candies, Chips, Cookies, Store Bought Juices, Alcohol in Excess of  1-2 drinks a day, Artificial Sweeteners, and "Sugar-free" Products. This will help patient to have stable blood glucose profile and potentially avoid unintended weight gain.   - I encouraged the patient to switch to  unprocessed  or minimally processed complex starch and increased protein intake (animal or plant source), fruits, and vegetables.  - Patient is advised to stick to a routine mealtimes to eat 3 meals  a day and avoid unnecessary snacks ( to snack only to correct hypoglycemia).   - I have approached patient with the following individualized plan to manage diabetes and patient agrees:  -  I will continue only with low-dose metformin 500 mg by mouth twice a day, therapeutically  suitable for patient. -He will not need insulin therapy for now. - Patient specific target  A1c;  LDL, HDL, Triglycerides, and  Waist Circumference were discussed in detail.  2) BP/HTN: Controlled. Continue current medications including ACEI/ARB. 3) Lipids/HPL:  Uncontrolled, continue statins. 4)  Weight/Diet: He has lost 9 pounds since last visit.  CDE Consult has been requested, exercise, and detailed carbohydrates information provided.  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 He is on multiple hormonal replacements including levothyroxine 112 g by mouth every morning, hydrocortisone 10 mg by mouth twice a day, desmopressin 0.1 mg by mouth twice a day, and testosterone 100 mg IM every 7 days -  His recent labs did not include total testosterone measurements. Treatment target for him will be testosterone level between 400-600. - His repeat MRI of sella/pituitary - is consistent with postsurgical changes with no new lesion. Summaries dictated,  see above.   - He is status post sinus drainage by ENT with clinical improvement.  5) Chronic Care/Health Maintenance:  -Patient  on ACEI/ARB and Statin medications and encouraged to continue to follow up with Ophthalmology, Podiatrist at least yearly or according to recommendations, and advised to   stay away from smoking. I have recommended yearly flu vaccine and pneumonia vaccination at least every 5 years; moderate intensity  exercise for up to 150 minutes weekly; and  sleep for at least 7 hours a day. - He will resume vitamin D replacement with 5000 units of vitamin D3 daily for 90 days.  - Time spent with the patient: 25 min, of which >50% was spent in reviewing his sugar logs , discussing his hypo- and hyper-glycemic episodes, reviewing his current and  previous labs and insulin doses and developing a plan to avoid hypo- and hyper-glycemia.   - I advised patient to maintain close follow up with Ernest Snowball, MD for primary care needs.  Follow up plan: - Return in about 3 months (around 09/15/2017) for follow up with pre-visit labs.  Marquis Lunch, MD Phone: [phone]  Fax: [phone]  This note was partially dictated with voice recognition software. Similar sounding words can be transcribed inadequately or may not  be corrected upon review.  06/15/2017, 9:44 AM

## 2017-08-10 ENCOUNTER — Other Ambulatory Visit: Payer: Self-pay

## 2017-08-10 MED ORDER — ATORVASTATIN CALCIUM 20 MG PO TABS: 20.0000 mg | ORAL_TABLET | Freq: Every day | ORAL | 1 refills | Status: DC

## 2017-08-24 ENCOUNTER — Other Ambulatory Visit: Payer: Self-pay

## 2017-08-24 MED ORDER — DESMOPRESSIN ACETATE 0.1 MG PO TABS: ORAL_TABLET | ORAL | 2 refills | Status: DC

## 2017-09-15 LAB — T4, FREE: Free T4: 1.34 ng/dL (ref 0.82–1.77)

## 2017-09-15 LAB — PSA: PROSTATE SPECIFIC AG, SERUM: 0.2 ng/mL (ref 0.0–4.0)

## 2017-09-15 LAB — T3, FREE: T3 FREE: 2.8 pg/mL (ref 2.0–4.4)

## 2017-09-15 LAB — TESTOSTERONE, FREE, TOTAL, SHBG
Sex Hormone Binding: 29.9 nmol/L (ref 19.3–76.4)
TESTOSTERONE: 89 ng/dL — AB (ref 264–916)
Testosterone, Free: 0.9 pg/mL — ABNORMAL LOW (ref 7.2–24.0)

## 2017-09-15 LAB — HEMOGLOBIN A1C
Est. average glucose Bld gHb Est-mCnc: 183 mg/dL
Hgb A1c MFr Bld: 8 % — ABNORMAL HIGH (ref 4.8–5.6)

## 2017-09-15 LAB — VITAMIN D 25 HYDROXY (VIT D DEFICIENCY, FRACTURES): VIT D 25 HYDROXY: 42.2 ng/mL (ref 30.0–100.0)

## 2017-09-16 ENCOUNTER — Other Ambulatory Visit: Payer: Self-pay

## 2017-09-16 MED ORDER — DESMOPRESSIN ACETATE 0.1 MG PO TABS: ORAL_TABLET | ORAL | 0 refills | Status: DC

## 2017-09-20 ENCOUNTER — Encounter: Payer: Self-pay | Admitting: "Endocrinology

## 2017-09-20 ENCOUNTER — Ambulatory Visit (INDEPENDENT_AMBULATORY_CARE_PROVIDER_SITE_OTHER): Payer: 59 | Admitting: "Endocrinology

## 2017-09-20 VITALS — BP 134/82 | HR 76 | Ht 70.0 in | Wt 265.0 lb

## 2017-09-20 DIAGNOSIS — E1159 Type 2 diabetes mellitus with other circulatory complications: Secondary | ICD-10-CM

## 2017-09-20 DIAGNOSIS — E230 Hypopituitarism: Secondary | ICD-10-CM

## 2017-09-20 MED ORDER — DULAGLUTIDE 0.75 MG/0.5ML ~~LOC~~ SOAJ: 0.7500 mg | SUBCUTANEOUS | 2 refills | Status: DC

## 2017-09-20 MED ORDER — METFORMIN HCL 500 MG PO TABS: ORAL_TABLET | ORAL | 1 refills | Status: DC

## 2017-09-20 MED ORDER — HYDROCORTISONE 5 MG PO TABS: 10.0000 mg | ORAL_TABLET | Freq: Two times a day (BID) | ORAL | 1 refills | Status: DC

## 2017-09-20 MED ORDER — ATORVASTATIN CALCIUM 20 MG PO TABS: 20.0000 mg | ORAL_TABLET | Freq: Every day | ORAL | 1 refills | Status: DC

## 2017-09-20 MED ORDER — LEVOTHYROXINE SODIUM 112 MCG PO TABS: 112.0000 ug | ORAL_TABLET | Freq: Every day | ORAL | 1 refills | Status: DC

## 2017-09-20 MED ORDER — DESMOPRESSIN ACETATE 0.1 MG PO TABS: ORAL_TABLET | ORAL | 1 refills | Status: DC

## 2017-09-20 NOTE — Progress Notes (Signed)
Subjective:    Patient ID: Ernest Graham, male    DOB: 09-Mar-1966. Patient is being seen in Follow-up for his type 2 diabetes, panhypopituitarism including hypothyroidism, diabetes insipidus, hypogonadism. 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  He is on multiple hormone replacements including levothyroxine, hydrocortisone, desmopressin, and testosterone. He is also being treated for type 2 diabetes with metformin.  - He continued to gain weight, admits to dietary indiscretion. He denies heat or cold intolerance. He denies polyuria, nocturia, nor recent hospitalizations.  Past Medical History:  Diagnosis Date  . Adrenal disease (HCC)   . Diabetes mellitus, type II (HCC)   . Heart attack (HCC)   . Hyperlipidemia    Past Surgical History:  Procedure Laterality Date  . CORONARY ARTERY BYPASS GRAFT    . MITRAL VALVE REPAIR    . Removal of Cricopharyngeoma     Social History   Socioeconomic History  . Marital status: Married    Spouse name: None  . Number of children: None  . Years of education: None  . Highest education level: None  Social Needs  . Financial resource strain: None  . Food insecurity - worry: None  . Food insecurity - inability: None  . Transportation needs - medical: None  . Transportation needs - non-medical: None  Occupational History  . None  Tobacco Use  . Smoking status: Former Games developer  . Smokeless tobacco: Never Used  Substance and Sexual Activity  . Alcohol use: No    Alcohol/week: 0.0 oz  . Drug use: No  . Sexual activity: None  Other Topics Concern  . None  Social History Narrative  . None   Outpatient Encounter Medications as of 09/20/2017  Medication Sig  . aspirin 325 MG tablet Take by mouth daily.   Marland Kitchen atorvastatin (LIPITOR) 20 MG tablet Take 1 tablet (20 mg total) by mouth daily.  . busPIRone (BUSPAR) 15 MG tablet Take 15 mg by mouth 2 (two) times daily.  . clonazePAM (KLONOPIN) 0.5 MG tablet Take 0.5 mg by  mouth 3 (three) times daily as needed for anxiety.  . [employer] D3 5000 units capsule TAKE ONE CAPSULE BY MOUTH EVERY DAY  . desmopressin (DDAVP) 0.1 MG tablet 1 qam & 2 tabs qhs  . FLUoxetine (PROZAC) 20 MG tablet Take 20 mg by mouth daily.  . hydrocortisone (CORTEF) 5 MG tablet Take 2 tablets (10 mg total) by mouth 2 (two) times daily.  Marland Kitchen levothyroxine (SYNTHROID, LEVOTHROID) 112 MCG tablet Take 1 tablet (112 mcg total) by mouth daily before breakfast.  . metFORMIN (GLUCOPHAGE) 500 MG tablet TAKE 1 TABLET BY MOUTH TWICE A DAY WITH A MEAL  . potassium chloride SA (K-DUR,KLOR-CON) 20 MEQ tablet Take 20 mEq by mouth daily.  Marland Kitchen testosterone cypionate (DEPOTESTOSTERONE CYPIONATE) 200 MG/ML injection Inject 0.5 mLs (100 mg total) into the muscle every 7 (seven) days.  . [DISCONTINUED] atorvastatin (LIPITOR) 20 MG tablet Take 1 tablet (20 mg total) by mouth daily.  . [DISCONTINUED] desmopressin (DDAVP) 0.1 MG tablet 1 qam & 2 tabs qhs  . [DISCONTINUED] hydrocortisone (CORTEF) 5 MG tablet Take 10 mg by mouth 2 (two) times daily.  . [DISCONTINUED] levothyroxine (SYNTHROID, LEVOTHROID) 112 MCG tablet Take 112 mcg by mouth daily before breakfast.  . [DISCONTINUED] metFORMIN (GLUCOPHAGE) 500 MG tablet TAKE 1 TABLET BY MOUTH TWICE A DAY WITH A MEAL  . Dulaglutide (TRULICITY) 0.75 MG/0.5ML SOPN Inject 0.75 mg into the skin once a week.   No  facility-administered encounter medications on file as of 09/20/2017.    ALLERGIES: No Known Allergies VACCINATION STATUS:  There is no immunization history on file for this patient.  Diabetes  He presents for his follow-up diabetic visit. He has type 2 diabetes mellitus. Onset time: He was diagnosed at approximate age of 1 years. His disease course has been worsening. Hypoglycemia symptoms include headaches. Pertinent negatives for hypoglycemia include no confusion, pallor or seizures. Associated symptoms include polydipsia and polyuria. Pertinent negatives for diabetes  include no chest pain, no fatigue, no polyphagia and no weakness. There are no hypoglycemic complications. Symptoms are worsening. There are no diabetic complications. (Triple bypass and mitral valve repair.) Risk factors for coronary artery disease include diabetes mellitus, dyslipidemia, hypertension, obesity, tobacco exposure and sedentary lifestyle. His weight is increasing steadily. He is following a generally unhealthy diet. When asked about meal planning, he reported none. He has not had a previous visit with a dietitian. He never participates in exercise. An ACE inhibitor/angiotensin II receptor blocker is being taken.  Hyperlipidemia  This is a chronic problem. Pertinent negatives include no chest pain, myalgias or shortness of breath. Current antihyperlipidemic treatment includes statins. Risk factors for coronary artery disease include diabetes mellitus, dyslipidemia, hypertension, male sex, obesity and a sedentary lifestyle.  Hypertension  This is a chronic problem. The current episode started more than 1 year ago. Associated symptoms include headaches. Pertinent negatives include no chest pain, neck pain, palpitations or shortness of breath. Risk factors for coronary artery disease include diabetes mellitus, dyslipidemia, male gender, obesity, smoking/tobacco exposure and sedentary lifestyle. Hypertensive end-organ damage includes kidney disease and CAD/MI.    Review of Systems  Constitutional: Negative for chills, fatigue, fever and unexpected weight change.  HENT: Negative for dental problem, mouth sores and trouble swallowing.   Eyes: Negative for visual disturbance.  Respiratory: Negative for cough, choking, chest tightness, shortness of breath and wheezing.   Cardiovascular: Negative for chest pain, palpitations and leg swelling.  Gastrointestinal: Negative for abdominal distention, abdominal pain, constipation, diarrhea, nausea and vomiting.  Endocrine: Positive for polydipsia and  polyuria. Negative for polyphagia.  Genitourinary: Negative for dysuria, flank pain, hematuria and urgency.  Musculoskeletal: Negative for back pain, gait problem, myalgias and neck pain.  Skin: Negative for pallor, rash and wound.  Neurological: Positive for headaches. Negative for seizures, syncope, weakness and numbness.  Psychiatric/Behavioral: Negative.  Negative for confusion and dysphoric mood.    Objective:    BP 134/82   Pulse 76   Ht 5\' 10"  (1.778 m)   Wt 265 lb (120.2 kg)   BMI 38.02 kg/m   Wt Readings from Last 3 Encounters:  09/20/17 265 lb (120.2 kg)  06/15/17 257 lb (116.6 kg)  08/18/16 266 lb (120.7 kg)    Physical Exam  Constitutional: He is oriented to person, place, and time. He appears well-developed and well-nourished. He is cooperative. No distress.  HENT:  Head: Normocephalic and atraumatic.  Eyes: EOM are normal.  Neck: Normal range of motion. Neck supple. No tracheal deviation present. No thyromegaly present.  Cardiovascular: Normal rate, S1 normal, S2 normal and normal heart sounds. Exam reveals no gallop.  No murmur heard. Pulses:      Dorsalis pedis pulses are 1+ on the right side, and 1+ on the left side.       Posterior tibial pulses are 1+ on the right side, and 1+ on the left side.  Pulmonary/Chest: Breath sounds normal. No respiratory distress. He has no wheezes.  Abdominal:  Soft. Bowel sounds are normal. He exhibits no distension. There is no tenderness. There is no guarding and no CVA tenderness.  Musculoskeletal: He exhibits no edema.       Right shoulder: He exhibits no swelling and no deformity.  Neurological: He is alert and oriented to person, place, and time. He has normal strength and normal reflexes. No cranial nerve deficit or sensory deficit. Gait normal.  Skin: Skin is warm and dry. No rash noted. No cyanosis. Nails show no clubbing.  Psychiatric: He has a normal mood and affect. His speech is normal and behavior is normal. Judgment  and thought content normal. Cognition and memory are normal.    - Summary of his pituitary/sella MRI from 08/13/2016 as follows - IMPRESSION: 1. Transsphenoidal resection of previous pituitary tumor without evidence for residual or recurrent neoplasm. 2. Residual enhancing pituitary tissue is within normal limits. 3. Slight rightward deviation of the pituitary stalk likely related to scarring and previous surgery. 4. Slight downward deviation of the optic chiasm, also likely related to prior surgery. 5. The suprasellar brain is normal for age. 6. Extensive sinus disease as described.  Recent Results (from the past 2160 hour(s))  Testosterone, Free, Total, SHBG     Status: Abnormal   Collection Time: 09/14/17  8:35 AM  Result Value Ref Range   Testosterone 89 (L) 264 - 916 ng/dL    Comment: Adult male reference interval is based on a population of healthy nonobese males (BMI <30) between 41 and 41 years old. Travison, et.al. JCEM [phone]. PMID: [id number].    Testosterone, Free 0.9 (L) 7.2 - 24.0 pg/mL   Sex Hormone Binding 29.9 19.3 - 76.4 nmol/L  T4, free     Status: None   Collection Time: 09/14/17  8:35 AM  Result Value Ref Range   Free T4 1.34 0.82 - 1.77 ng/dL  T3, free     Status: None   Collection Time: 09/14/17  8:35 AM  Result Value Ref Range   T3, Free 2.8 2.0 - 4.4 pg/mL  Hemoglobin A1c     Status: Abnormal   Collection Time: 09/14/17  8:35 AM  Result Value Ref Range   Hgb A1c MFr Bld 8.0 (H) 4.8 - 5.6 %    Comment:          Prediabetes: 5.7 - 6.4          Diabetes: >6.4          Glycemic control for adults with diabetes: <7.0    Est. average glucose Bld gHb Est-mCnc 183 mg/dL  VITAMIN D 25 Hydroxy (Vit-D Deficiency, Fractures)     Status: None   Collection Time: 09/14/17  8:35 AM  Result Value Ref Range   Vit D, 25-Hydroxy 42.2 30.0 - 100.0 ng/mL    Comment: Vitamin D deficiency has been defined by the Institute of Medicine and an Endocrine Society  practice guideline as a level of serum 25-OH vitamin D less than 20 ng/mL (1,2). The Endocrine Society went on to further define vitamin D insufficiency as a level between 21 and 29 ng/mL (2). 1. IOM (Institute of Medicine). 2010. Dietary reference    intakes for calcium and D. [place]: The    [employer]. 2. Holick MF, Binkley Fouke, Bischoff-Ferrari HA, et al.    Evaluation, treatment, and prevention of vitamin D    deficiency: an Endocrine Society clinical practice    guideline. JCEM. 2011 Jul; 96(7):1911-30.   PSA     Status:  None   Collection Time: 09/14/17  8:35 AM  Result Value Ref Range   Prostate Specific Ag, Serum 0.2 0.0 - 4.0 ng/mL    Comment: Roche ECLIA methodology. According to the American Urological Association, Serum PSA should decrease and remain at undetectable levels after radical prostatectomy. The AUA defines biochemical recurrence as an initial PSA value 0.2 ng/mL or greater followed by a subsequent confirmatory PSA value 0.2 ng/mL or greater. Values obtained with different assay methods or kits cannot be used interchangeably. Results cannot be interpreted as absolute evidence of the presence or absence of malignant disease.      Assessment & Plan:   1. 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 Hyperlipidemia 3.hypertension 4. Panhypopituitarism (HCC) 5. Vitamin D deficiency  - Patient has currently controlled asymptomatic type 2 DM since  52 years of age,  with most recent A1c of 8% increasing from  7%,  after generally improving from 10.4 %. Recent labs reviewed.   His diabetes is complicated by coronary artery disease status post triple bypass and mitral valve repair and patient remains at a high risk for more acute and chronic complications of diabetes which include CAD, CVA, CKD, retinopathy, and neuropathy. These are all discussed in detail with the patient.  - I have counseled the patient on diet management and weight loss,  by adopting a carbohydrate restricted/protein rich diet.  -  Suggestion is made for him to avoid simple carbohydrates  from his diet including Cakes, Sweet Desserts / Pastries, Ice Cream, Soda (diet and regular), Sweet Tea, Candies, Chips, Cookies, Store Bought Juices, Alcohol in Excess of  1-2 drinks a day, Artificial Sweeteners, and "Sugar-free" Products. This will help patient to have stable blood glucose profile and potentially avoid unintended weight gain.   - I encouraged the patient to switch to  unprocessed or minimally processed complex starch and increased protein intake (animal or plant source), fruits, and vegetables.  - Patient is advised to stick to a routine mealtimes to eat 3 meals  a day and avoid unnecessary snacks ( to snack only to correct hypoglycemia).   - I have approached patient with the following individualized plan to manage diabetes and patient agrees:  -  Given his loss of control of diabetes, high discussed and initiated weekly Trulicity 0.75 subcutaneously to advance as tolerated. -  I will continue with low-dose metformin 500 mg by mouth twice a day, therapeutically suitable for patient.  -He will not need insulin therapy for now. - Patient specific target  A1c;  LDL, HDL, Triglycerides, and  Waist Circumference were discussed in detail.  2) BP/HTN:   blood pressure is controlled to target, I advised him to continue his current blood pressure medications including  ACEI/ARB. 3) Lipids/HPL:  Uncontrolled, continue statins. 4)  Weight/Diet: He  is regaining his weight .  CDE Consult has been requested, exercise, and detailed carbohydrates information provided.  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 He is on multiple hormonal replacements including levothyroxine 112 g by mouth every morning, hydrocortisone 10 mg by mouth twice a day, desmopressin 0.1 mg by mouth twice a day, and testosterone 100 mg IM  every 7 days -  His recent labs show subnormal  total testosterone, due to the fact that he stayed on 100 mg every other week instead of every week. -  I advised him to resume testosterone 100 mg IM every 7 days .Treatment target for him  will be testosterone level between 400-600. - His repeat MRI of sella/pituitary - is consistent with postsurgical changes with no new lesion. Summaries dictated,  see above.   - He is status post sinus drainage by ENT with clinical improvement.  5) Chronic Care/Health Maintenance:  -Patient  on ACEI/ARB and Statin medications and encouraged to continue to follow up with Ophthalmology, Podiatrist at least yearly or according to recommendations, and advised to   stay away from smoking. I have recommended yearly flu vaccine and pneumonia vaccination at least every 5 years; moderate intensity exercise for up to 150 minutes weekly; and  sleep for at least 7 hours a day. - He will resume vitamin D replacement with 5000 units of vitamin D3 daily for 90 days.   - I advised patient to maintain close follow up with Lamont Snowballavis, Stephen, MD for primary care needs.  Follow up plan: - Return in about 3 months (around 12/19/2017) for meter, and logs.  Marquis LunchGebre Nida, MD Phone: [phone]  Fax: [phone]  This note was partially dictated with voice recognition software. Similar sounding words can be transcribed inadequately or may not  be corrected upon review.  09/20/2017, 10:37 AM

## 2017-09-20 NOTE — Patient Instructions (Signed)

## 2017-11-14 ENCOUNTER — Other Ambulatory Visit: Payer: Self-pay | Admitting: "Endocrinology

## 2017-12-13 ENCOUNTER — Other Ambulatory Visit: Payer: Self-pay | Admitting: "Endocrinology

## 2017-12-14 LAB — CBC WITH DIFFERENTIAL/PLATELET
BASOS: 1 %
Basophils Absolute: 0.1 10*3/uL (ref 0.0–0.2)
EOS (ABSOLUTE): 0.2 10*3/uL (ref 0.0–0.4)
EOS: 3 %
HEMATOCRIT: 47.6 % (ref 37.5–51.0)
Hemoglobin: 16.4 g/dL (ref 13.0–17.7)
Immature Grans (Abs): 0 10*3/uL (ref 0.0–0.1)
Immature Granulocytes: 0 %
LYMPHS ABS: 2.8 10*3/uL (ref 0.7–3.1)
Lymphs: 33 %
MCH: 30.4 pg (ref 26.6–33.0)
MCHC: 34.5 g/dL (ref 31.5–35.7)
MCV: 88 fL (ref 79–97)
MONOS ABS: 1 10*3/uL — AB (ref 0.1–0.9)
Monocytes: 11 %
NEUTROS ABS: 4.5 10*3/uL (ref 1.4–7.0)
Neutrophils: 52 %
PLATELETS: 210 10*3/uL (ref 150–379)
RBC: 5.4 x10E6/uL (ref 4.14–5.80)
RDW: 13.9 % (ref 12.3–15.4)
WBC: 8.6 10*3/uL (ref 3.4–10.8)

## 2017-12-14 LAB — COMPREHENSIVE METABOLIC PANEL
ALT: 32 IU/L (ref 0–44)
AST: 18 IU/L (ref 0–40)
Albumin/Globulin Ratio: 1.5 (ref 1.2–2.2)
Albumin: 4.2 g/dL (ref 3.5–5.5)
Alkaline Phosphatase: 104 IU/L (ref 39–117)
BILIRUBIN TOTAL: 0.5 mg/dL (ref 0.0–1.2)
BUN/Creatinine Ratio: 9 (ref 9–20)
BUN: 12 mg/dL (ref 6–24)
CHLORIDE: 100 mmol/L (ref 96–106)
CO2: 22 mmol/L (ref 20–29)
Calcium: 9.4 mg/dL (ref 8.7–10.2)
Creatinine, Ser: 1.38 mg/dL — ABNORMAL HIGH (ref 0.76–1.27)
GFR calc Af Amer: 67 mL/min/{1.73_m2} (ref >59)
GFR, EST NON AFRICAN AMERICAN: 58 mL/min/{1.73_m2} — AB (ref >59)
GLOBULIN, TOTAL: 2.8 g/dL (ref 1.5–4.5)
Glucose: 126 mg/dL — ABNORMAL HIGH (ref 65–99)
POTASSIUM: 4.6 mmol/L (ref 3.5–5.2)
SODIUM: 140 mmol/L (ref 134–144)
Total Protein: 7 g/dL (ref 6.0–8.5)

## 2017-12-14 LAB — HGB A1C W/O EAG: HEMOGLOBIN A1C: 6.5 % — AB (ref 4.8–5.6)

## 2017-12-14 LAB — VITAMIN D 25 HYDROXY (VIT D DEFICIENCY, FRACTURES): VIT D 25 HYDROXY: 27.3 ng/mL — AB (ref 30.0–100.0)

## 2017-12-14 LAB — TESTOSTERONE,FREE AND TOTAL
TESTOSTERONE FREE: 6.3 pg/mL — AB (ref 7.2–24.0)
Testosterone: 473 ng/dL (ref 264–916)

## 2017-12-14 LAB — T4, FREE: FREE T4: 1.27 ng/dL (ref 0.82–1.77)

## 2017-12-14 LAB — T3, FREE: T3, Free: 3.3 pg/mL (ref 2.0–4.4)

## 2017-12-19 ENCOUNTER — Other Ambulatory Visit: Payer: Self-pay | Admitting: "Endocrinology

## 2017-12-20 ENCOUNTER — Encounter: Payer: Self-pay | Admitting: "Endocrinology

## 2017-12-20 ENCOUNTER — Ambulatory Visit (INDEPENDENT_AMBULATORY_CARE_PROVIDER_SITE_OTHER): Payer: 59 | Admitting: "Endocrinology

## 2017-12-20 VITALS — BP 136/82 | HR 76 | Ht 70.0 in | Wt 256.0 lb

## 2017-12-20 DIAGNOSIS — E1159 Type 2 diabetes mellitus with other circulatory complications: Secondary | ICD-10-CM | POA: Diagnosis not present

## 2017-12-20 DIAGNOSIS — E230 Hypopituitarism: Secondary | ICD-10-CM | POA: Diagnosis not present

## 2017-12-20 NOTE — Patient Instructions (Signed)

## 2017-12-20 NOTE — Progress Notes (Signed)
Endocrinology follow-up note  Subjective:    Patient ID: Ernest Graham, male    DOB: 1965-09-30. Patient is being seen in Follow-up for his type 2 diabetes, panhypopituitarism including hypothyroidism, diabetes insipidus, hypogonadism. 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  He is on multiple hormone replacements including levothyroxine, hydrocortisone, desmopressin, and testosterone. He is also being treated for type 2 diabetes with metformin and Trulicity. -He has achieved better control of diabetes since last visit.  He continued to lose weight, reports improvement in his dietary restrictions.    He denies heat or cold intolerance. He denies polyuria, nocturia, nor recent hospitalizations.  Past Medical History:  Diagnosis Date  . Adrenal disease (HCC)   . Diabetes mellitus, type II (HCC)   . Heart attack (HCC)   . Hyperlipidemia    Past Surgical History:  Procedure Laterality Date  . CORONARY ARTERY BYPASS GRAFT    . MITRAL VALVE REPAIR    . Removal of Cricopharyngeoma     Social History   Socioeconomic History  . Marital status: Married    Spouse name: Not on file  . Number of children: Not on file  . Years of education: Not on file  . Highest education level: Not on file  Occupational History  . Not on file  Social Needs  . Financial resource strain: Not on file  . Food insecurity:    Worry: Not on file    Inability: Not on file  . Transportation needs:    Medical: Not on file    Non-medical: Not on file  Tobacco Use  . Smoking status: Former Games developer  . Smokeless tobacco: Never Used  Substance and Sexual Activity  . Alcohol use: No    Alcohol/week: 0.0 oz  . Drug use: No  . Sexual activity: Not on file  Lifestyle  . Physical activity:    Days per week: Not on file    Minutes per session: Not on file  . Stress: Not on file  Relationships  . Social connections:    Talks on  phone: Not on file    Gets together: Not on file    Attends religious service: Not on file    Active member of club or organization: Not on file    Attends meetings of clubs or organizations: Not on file    Relationship status: Not on file  Other Topics Concern  . Not on file  Social History Narrative  . Not on file   Outpatient Encounter Medications as of 12/20/2017  Medication Sig  . aspirin 325 MG tablet Take by mouth daily.   Marland Kitchen atorvastatin (LIPITOR) 20 MG tablet Take 1 tablet (20 mg total) by mouth daily.  . busPIRone (BUSPAR) 15 MG tablet Take 15 mg by mouth 2 (two) times daily.  . clonazePAM (KLONOPIN) 0.5 MG tablet Take 0.5 mg by mouth 3 (three) times daily as needed for anxiety.  . [facility] D3 5000 units capsule TAKE ONE CAPSULE BY MOUTH EVERY DAY  . desmopressin (DDAVP) 0.1 MG tablet 1 qam & 2 tabs qhs  . FLUoxetine (PROZAC) 20 MG tablet Take 20 mg by mouth daily.  . hydrocortisone (CORTEF) 5 MG tablet Take 2 tablets (10 mg total) by mouth 2 (two) times daily.  Marland Kitchen levothyroxine (SYNTHROID, LEVOTHROID) 112 MCG tablet Take 1 tablet (  112 mcg total) by mouth daily before breakfast.  . metFORMIN (GLUCOPHAGE) 500 MG tablet TAKE 1 TABLET BY MOUTH TWICE A DAY WITH A MEAL  . potassium chloride SA (K-DUR,KLOR-CON) 20 MEQ tablet Take 20 mEq by mouth daily.  Marland Kitchen testosterone cypionate (DEPOTESTOSTERONE CYPIONATE) 200 MG/ML injection INJECT 0.5 MLS INTO THE MUSCLE EVERY 14 DAYS (Patient taking differently: INJECT 0.5 MLS INTO THE MUSCLE EVERY week)  . TRULICITY 0.75 MG/0.5ML SOPN INJECT 0.75 MG(0.5ML) INTO THE SKIN ONCE A WEEK.   No facility-administered encounter medications on file as of 12/20/2017.    ALLERGIES: No Known Allergies VACCINATION STATUS:  There is no immunization history on file for this patient.  Diabetes  He presents for his follow-up diabetic visit. He has type 2 diabetes mellitus. Onset time: He was diagnosed at approximate age of 45 years. His disease course has been  improving. Hypoglycemia symptoms include headaches. Pertinent negatives for hypoglycemia include no confusion, pallor or seizures. Pertinent negatives for diabetes include no chest pain, no fatigue, no polydipsia, no polyphagia, no polyuria and no weakness. There are no hypoglycemic complications. Symptoms are improving. There are no diabetic complications. (Triple bypass and mitral valve repair.) Risk factors for coronary artery disease include diabetes mellitus, dyslipidemia, hypertension, obesity, tobacco exposure and sedentary lifestyle. His weight is decreasing steadily. He is following a generally unhealthy diet. When asked about meal planning, he reported none. He has not had a previous visit with a dietitian. He never participates in exercise. An ACE inhibitor/angiotensin II receptor blocker is being taken.  Hyperlipidemia  This is a chronic problem. The current episode started more than 1 year ago. The problem is uncontrolled. Exacerbating diseases include diabetes, hypothyroidism and obesity. Pertinent negatives include no chest pain, myalgias or shortness of breath. Current antihyperlipidemic treatment includes statins. Risk factors for coronary artery disease include diabetes mellitus, dyslipidemia, hypertension, male sex, obesity and a sedentary lifestyle.  Hypertension  This is a chronic problem. The current episode started more than 1 year ago. The problem is controlled. Associated symptoms include headaches. Pertinent negatives include no chest pain, neck pain, palpitations or shortness of breath. Risk factors for coronary artery disease include diabetes mellitus, dyslipidemia, male gender, obesity, smoking/tobacco exposure and sedentary lifestyle. Past treatments include ACE inhibitors. Hypertensive end-organ damage includes kidney disease and CAD/MI.    Review of Systems  Constitutional: Negative for chills, fatigue, fever and unexpected weight change.  HENT: Negative for dental problem,  mouth sores and trouble swallowing.   Eyes: Negative for visual disturbance.  Respiratory: Negative for cough, choking, chest tightness, shortness of breath and wheezing.   Cardiovascular: Negative for chest pain, palpitations and leg swelling.  Gastrointestinal: Negative for abdominal distention, abdominal pain, constipation, diarrhea, nausea and vomiting.  Endocrine: Negative for polydipsia, polyphagia and polyuria.  Genitourinary: Negative for dysuria, flank pain, hematuria and urgency.  Musculoskeletal: Negative for back pain, gait problem, myalgias and neck pain.  Skin: Negative for pallor, rash and wound.  Neurological: Positive for headaches. Negative for seizures, syncope, weakness and numbness.  Psychiatric/Behavioral: Negative.  Negative for confusion and dysphoric mood.    Objective:    BP 136/82   Pulse 76   Ht 5\' 10"  (1.778 m)   Wt 256 lb (116.1 kg)   BMI 36.73 kg/m   Wt Readings from Last 3 Encounters:  12/20/17 256 lb (116.1 kg)  09/20/17 265 lb (120.2 kg)  06/15/17 257 lb (116.6 kg)    Physical Exam  Constitutional: He is oriented to person, place, and time.  He appears well-developed. He is cooperative. No distress.  HENT:  Head: Normocephalic and atraumatic.  Eyes: EOM are normal.  Neck: Normal range of motion. Neck supple. No tracheal deviation present. No thyromegaly present.  Cardiovascular: Normal rate, S1 normal and S2 normal. Exam reveals no gallop.  No murmur heard. Pulses:      Dorsalis pedis pulses are 1+ on the right side, and 1+ on the left side.       Posterior tibial pulses are 1+ on the right side, and 1+ on the left side.  Pulmonary/Chest: Effort normal. No respiratory distress. He has no wheezes.  Abdominal: He exhibits no distension. There is no tenderness. There is no guarding and no CVA tenderness.  Musculoskeletal: He exhibits no edema.       Right shoulder: He exhibits no swelling and no deformity.  Neurological: He is alert and oriented  to person, place, and time. He has normal strength and normal reflexes. No cranial nerve deficit or sensory deficit. Gait normal.  Skin: Skin is warm and dry. No rash noted. No cyanosis. Nails show no clubbing.  Psychiatric: He has a normal mood and affect. His speech is normal and behavior is normal. Judgment and thought content normal. Cognition and memory are normal.    - Summary of his pituitary/sella MRI from 08/13/2016 as follows - IMPRESSION: 1. Transsphenoidal resection of previous pituitary tumor without evidence for residual or recurrent neoplasm. 2. Residual enhancing pituitary tissue is within normal limits. 3. Slight rightward deviation of the pituitary stalk likely related to scarring and previous surgery. 4. Slight downward deviation of the optic chiasm, also likely related to prior surgery. 5. The suprasellar brain is normal for age. 6. Extensive sinus disease as described.  Recent Results (from the past 2160 hour(s))  CBC with Differential/Platelet     Status: Abnormal   Collection Time: 12/13/17  8:24 AM  Result Value Ref Range   WBC 8.6 3.4 - 10.8 x10E3/uL   RBC 5.40 4.14 - 5.80 x10E6/uL   Hemoglobin 16.4 13.0 - 17.7 g/dL   Hematocrit 40.9 81.1 - 51.0 %   MCV 88 79 - 97 fL   MCH 30.4 26.6 - 33.0 pg   MCHC 34.5 31.5 - 35.7 g/dL   RDW 91.4 78.2 - 95.6 %   Platelets 210 150 - 379 x10E3/uL   Neutrophils 52 Not Estab. %   Lymphs 33 Not Estab. %   Monocytes 11 Not Estab. %   Eos 3 Not Estab. %   Basos 1 Not Estab. %   Neutrophils Absolute 4.5 1.4 - 7.0 x10E3/uL   Lymphocytes Absolute 2.8 0.7 - 3.1 x10E3/uL   Monocytes Absolute 1.0 (H) 0.1 - 0.9 x10E3/uL   EOS (ABSOLUTE) 0.2 0.0 - 0.4 x10E3/uL   Basophils Absolute 0.1 0.0 - 0.2 x10E3/uL   Immature Granulocytes 0 Not Estab. %   Immature Grans (Abs) 0.0 0.0 - 0.1 x10E3/uL  Comprehensive metabolic panel     Status: Abnormal   Collection Time: 12/13/17  8:24 AM  Result Value Ref Range   Glucose 126 (H) 65 - 99  mg/dL   BUN 12 6 - 24 mg/dL   Creatinine, Ser 2.13 (H) 0.76 - 1.27 mg/dL   GFR calc non Af Amer 58 (L) >59 mL/min/1.73   GFR calc Af Amer 67 >59 mL/min/1.73   BUN/Creatinine Ratio 9 9 - 20   Sodium 140 134 - 144 mmol/L   Potassium 4.6 3.5 - 5.2 mmol/L   Chloride 100 96 -  106 mmol/L   CO2 22 20 - 29 mmol/L   Calcium 9.4 8.7 - 10.2 mg/dL   Total Protein 7.0 6.0 - 8.5 g/dL   Albumin 4.2 3.5 - 5.5 g/dL   Globulin, Total 2.8 1.5 - 4.5 g/dL   Albumin/Globulin Ratio 1.5 1.2 - 2.2   Bilirubin Total 0.5 0.0 - 1.2 mg/dL   Alkaline Phosphatase 104 39 - 117 IU/L   AST 18 0 - 40 IU/L   ALT 32 0 - 44 IU/L  Testosterone,Free and Total     Status: Abnormal   Collection Time: 12/13/17  8:24 AM  Result Value Ref Range   Testosterone 473 264 - 916 ng/dL    Comment: Adult male reference interval is based on a population of healthy nonobese males (BMI <30) between 7 and 5 years old. Travison, et.al. JCEM [phone]. PMID: [id number].    Testosterone, Free 6.3 (L) 7.2 - 24.0 pg/mL  Hgb A1c w/o eAG     Status: Abnormal   Collection Time: 12/13/17  8:24 AM  Result Value Ref Range   Hgb A1c MFr Bld 6.5 (H) 4.8 - 5.6 %    Comment:          Prediabetes: 5.7 - 6.4          Diabetes: >6.4          Glycemic control for adults with diabetes: <7.0   T4, free     Status: None   Collection Time: 12/13/17  8:24 AM  Result Value Ref Range   Free T4 1.27 0.82 - 1.77 ng/dL  VITAMIN D 25 Hydroxy (Vit-D Deficiency, Fractures)     Status: Abnormal   Collection Time: 12/13/17  8:24 AM  Result Value Ref Range   Vit D, 25-Hydroxy 27.3 (L) 30.0 - 100.0 ng/mL    Comment: Vitamin D deficiency has been defined by the Institute of Medicine and an Endocrine Society practice guideline as a level of serum 25-OH vitamin D less than 20 ng/mL (1,2). The Endocrine Society went on to further define vitamin D insufficiency as a level between 21 and 29 ng/mL (2). 1. IOM (Institute of Medicine). 2010. Dietary  reference    intakes for calcium and D. [place]: The    [employer]. 2. Holick MF, Binkley Jennings, Bischoff-Ferrari HA, et al.    Evaluation, treatment, and prevention of vitamin D    deficiency: an Endocrine Society clinical practice    guideline. JCEM. 2011 Jul; 96(7):1911-30.   T3, free     Status: None   Collection Time: 12/13/17  8:24 AM  Result Value Ref Range   T3, Free 3.3 2.0 - 4.4 pg/mL   Lipid Panel     Component Value Date/Time   CHOL 182 08/12/2016 1018   TRIG 341 (H) 08/12/2016 1018   HDL 28 (L) 08/12/2016 1018   CHOLHDL 6.5 (H) 08/12/2016 1018   LDLCALC 86 08/12/2016 1018    Assessment & Plan:   1. 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 Hyperlipidemia 3.hypertension 4. Panhypopituitarism (HCC) 5. Vitamin D deficiency  - Patient has currently controlled asymptomatic type 2 DM since  52 years of age,  with most recent A1c of 6.5% , generally improving from 10.4% Recent labs reviewed.   His diabetes is complicated by coronary artery disease status post triple bypass and mitral valve repair and patient remains at a high risk for more acute and chronic complications of diabetes which include CAD, CVA, CKD, retinopathy, and neuropathy.  These are all discussed in detail with the patient.  - I have counseled the patient on diet management and weight loss, by adopting a carbohydrate restricted/protein rich diet.  -  Suggestion is made for him to avoid simple carbohydrates  from his diet including Cakes, Sweet Desserts / Pastries, Ice Cream, Soda (diet and regular), Sweet Tea, Candies, Chips, Cookies, Store Bought Juices, Alcohol in Excess of  1-2 drinks a day, Artificial Sweeteners, and "Sugar-free" Products. This will help patient to have stable blood glucose profile and potentially avoid unintended weight gain.  - I encouraged the patient to switch to  unprocessed or minimally processed complex starch and increased protein intake (animal or plant  source), fruits, and vegetables.  - Patient is advised to stick to a routine mealtimes to eat 3 meals  a day and avoid unnecessary snacks ( to snack only to correct hypoglycemia).   - I have approached patient with the following individualized plan to manage diabetes and patient agrees:  -He has a better success controlling diabetes type 2 with his current regimen.  -I advised him to continue Trulicity 0.75 mg subcutaneously weekly, metformin 500 mg p.o. twice daily with meals.  Medications are suitable for him at this time.  -He will not need insulin therapy for now.  - Patient specific target  A1c;  LDL, HDL, Triglycerides, and  Waist Circumference were discussed in detail.  2) BP/HTN:   His blood pressure is controlled to target, I advised him to continue his current blood pressure medications including  ACEI/ARB. 3) Lipids/HPL:  Uncontrolled, LDL at 86 with hypertriglyceridemia.  He is advised to continue statins. 4)  Weight/Diet: He  is losing  weight .  CDE Consult has been requested, exercise, and detailed carbohydrates information provided.  6) Panhypopituitarism: This is from surgery for craniopharyngioma at age 13 at [facility]. He is on multiple hormonal replacements including levothyroxine 112 g by mouth every morning, hydrocortisone 10 mg by mouth twice a day, desmopressin 0.1 mg by mouth twice a day, and testosterone 100 mg IM every 7 days -  His recent labs show subnormal  total testosterone of 473, improving from 89 during last test when he had significant interruption in his testosterone injections.  - Treatment target for him will be testosterone level between 400-600. - His repeat MRI of sella/pituitary in 2017- is consistent with postsurgical changes with no new lesion. Summaries dictated,  see above.   - He is status post sinus drainage by ENT with clinical improvement. He does not have any clinical complaints of mass-effect at this time. 5) Chronic Care/Health  Maintenance:  -Patient  on ACEI/ARB and Statin medications and encouraged to continue to follow up with Ophthalmology, Podiatrist at least yearly or according to recommendations, and advised to   stay away from smoking. I have recommended yearly flu vaccine and pneumonia vaccination at least every 5 years; moderate intensity exercise for up to 150 minutes weekly; and  sleep for at least 7 hours a day. - He he is advised to  resume vitamin D replacement with 5000 units of vitamin D3 daily for 90 days.   - I advised patient to maintain close follow up with Alinda Deem, MD for primary care needs. - Time spent with the patient: 25 min, of which >50% was spent in reviewing his blood glucose logs , discussing his hypo- and hyper-glycemic episodes, reviewing his current and  previous labs and insulin doses and developing a plan to avoid hypo- and  hyper-glycemia. Please refer to Patient Instructions for Blood Glucose Monitoring and Insulin/Medications Dosing Guide"  in media tab for additional information. Ernest Graham participated in the discussions, expressed understanding, and voiced agreement with the above plans.  All questions were answered to his satisfaction. he is encouraged to contact clinic should he have any questions or concerns prior to his return visit.  Follow up plan: - Return in about 4 months (around 04/21/2018) for follow up with pre-visit labs.  Marquis LunchGebre Nida, MD Phone: [phone]  Fax: [phone]  This note was partially dictated with voice recognition software. Similar sounding words can be transcribed inadequately or may not  be corrected upon review.  12/20/2017, 10:45 AM

## 2017-12-22 ENCOUNTER — Telehealth: Payer: Self-pay

## 2017-12-22 NOTE — Telephone Encounter (Signed)
Pts wife wants to know if he need to increase his hydrocortisone while he is sick. She says he is running a temp, throwing up for the past 2 days

## 2017-12-22 NOTE — Telephone Encounter (Signed)
Yes , he can double his hydrocortisone to 5 days during illness.

## 2017-12-23 NOTE — Telephone Encounter (Signed)
Pt.notified

## 2017-12-30 ENCOUNTER — Other Ambulatory Visit: Payer: Self-pay | Admitting: "Endocrinology

## 2018-04-08 IMAGING — MR MR HEAD WO/W CM
6 of 20 series · 27 of 48 positions shown · IV contrast (multihance)
Comparison: None.

CLINICAL DATA: Pan hypopituitarism. Personal history of
craniopharyngioma in previous surgery. Headaches and sinus
congestion for 1 year.

EXAM:
MRI HEAD WITHOUT AND WITH CONTRAST
TECHNIQUE: Multiplanar, multiecho pulse sequences of the brain and surrounding
structures were obtained without and with intravenous contrast.
CONTRAST:  20mL MULTIHANCE GADOBENATE DIMEGLUMINE 529 MG/ML IV SOLN

[Series 3: DWI · axial · 3.0mm · 0.74mm/px · z∈[-77,+82]mm · 5 of 54 slices shown (1 of 4)]
[im 1/54]
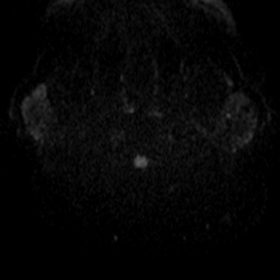
[im 14/54]
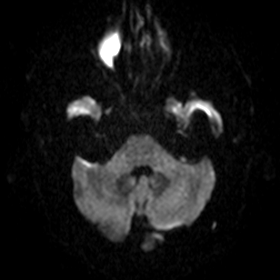
[im 27/54]
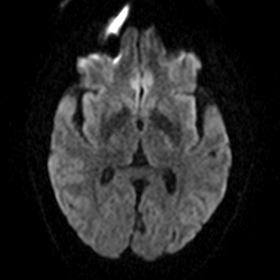
[im 40/54]
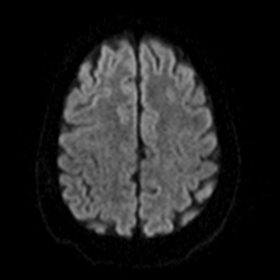
[im 54/54]
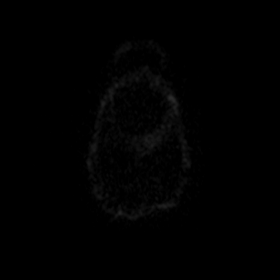

[Series 4: DWI · axial · 3.0mm · 0.77mm/px · z∈[-80,+82]mm · 5 of 55 slices shown (2 of 4)]
[im 1/55]
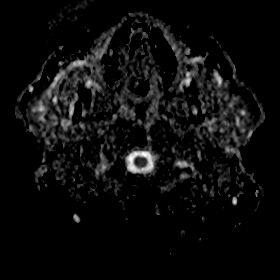
[im 14/55]
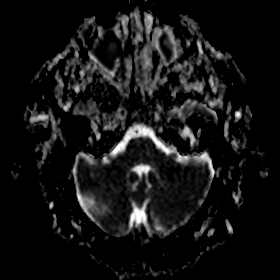
[im 28/55]
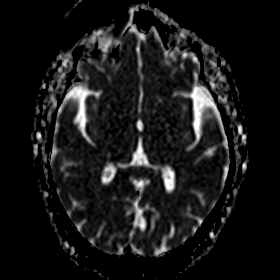
[im 41/55]
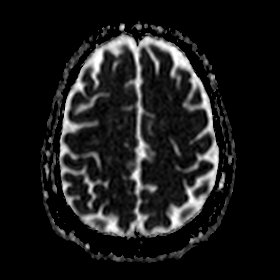
[im 55/55]
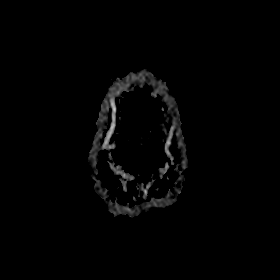

[Series 5: DWI · coronal · 5.0mm · 0.48mm/px · 4 of 33 slices shown (3 of 4)]
[im 1/33]
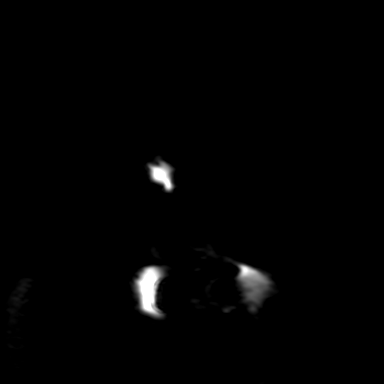
[im 11/33]
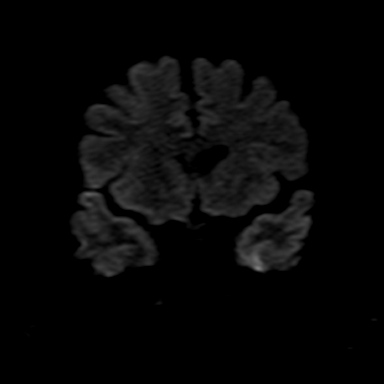
[im 22/33]
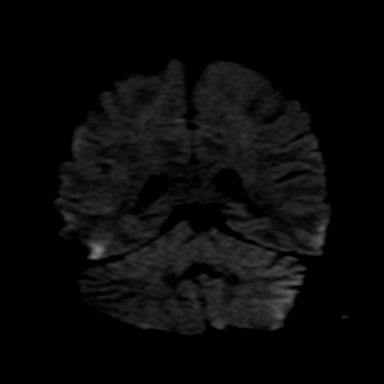
[im 33/33]
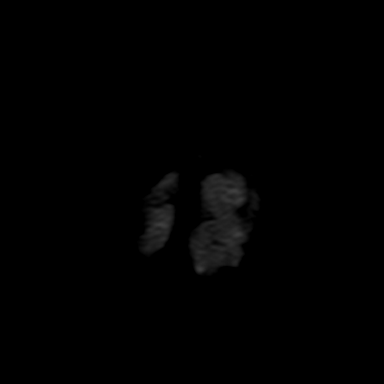

[Series 6: DWI · coronal · 5.0mm · 0.50mm/px · 4 of 34 slices shown (4 of 4)]
[im 1/34]
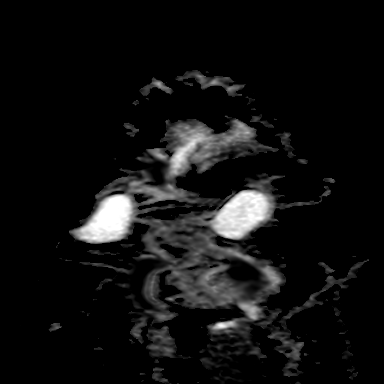
[im 12/34]
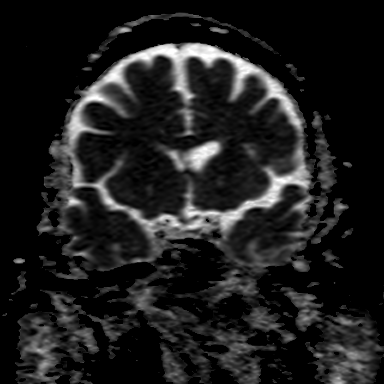
[im 23/34]
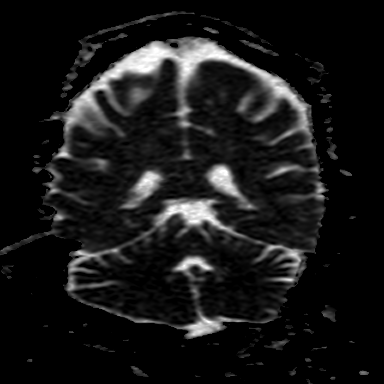
[im 34/34]
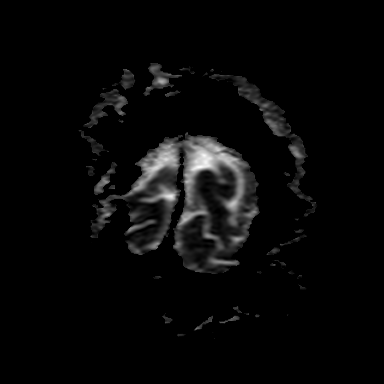

[Series 8: FLAIR · axial · 5.0mm · 0.35mm/px · z∈[-75,+81]mm · 3 of 25 slices shown]
[im 1/25]
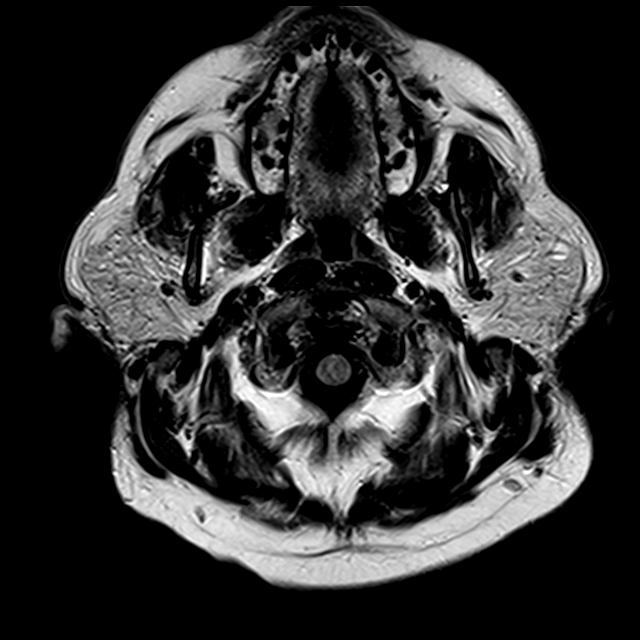
[im 13/25]
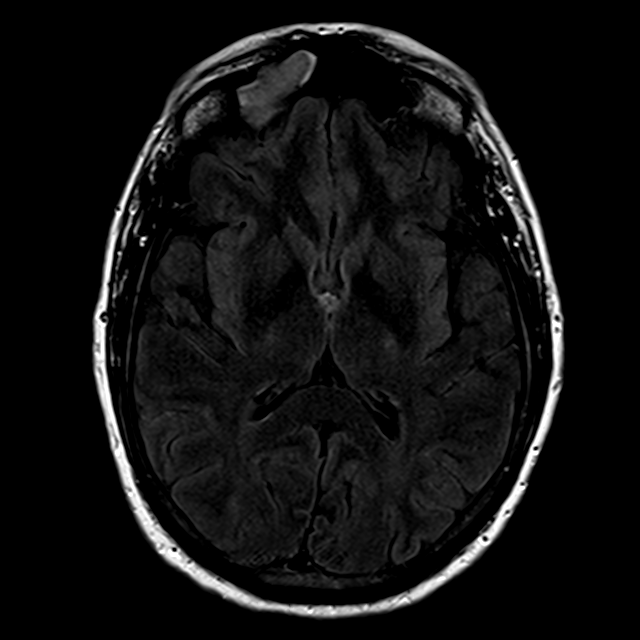
[im 25/25]
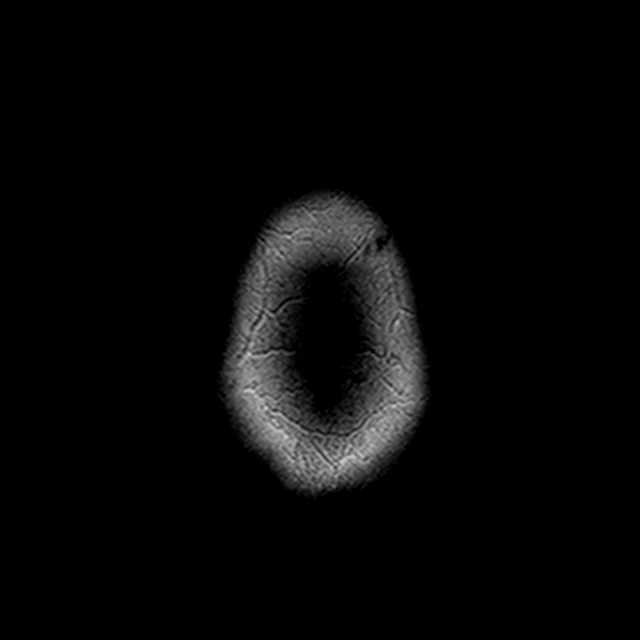

[Series 20: T1 post-contrast · axial · 3.0mm · 0.42mm/px · z∈[-85,+62]mm · 6 of 60 slices shown]
[im 1/60]
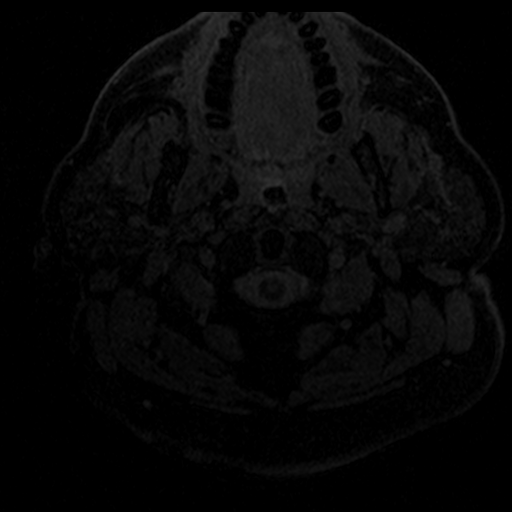
[im 10/60]
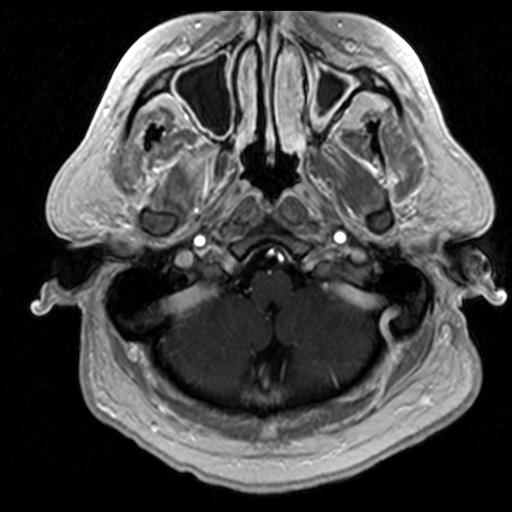
[im 20/60]
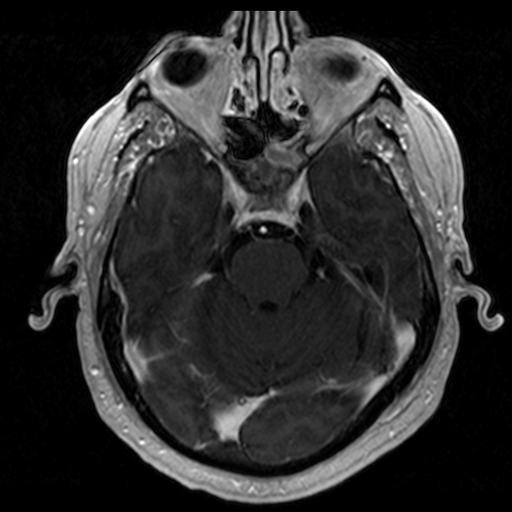
[im 30/60]
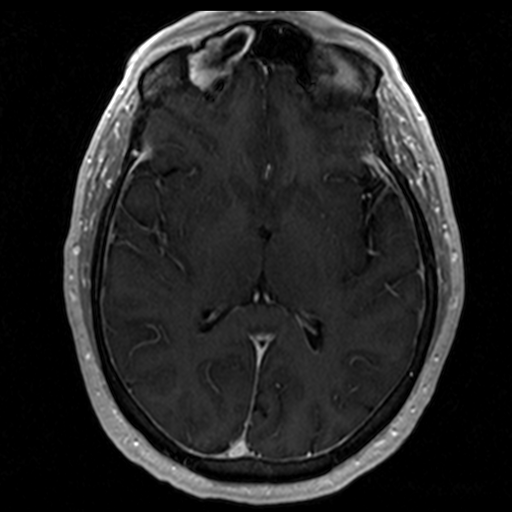
[im 40/60]
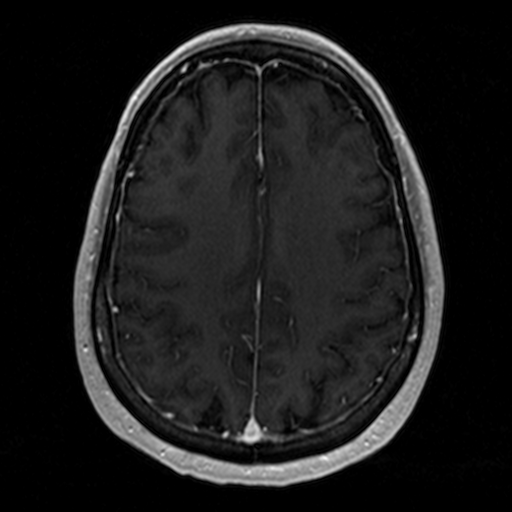
[im 50/60]
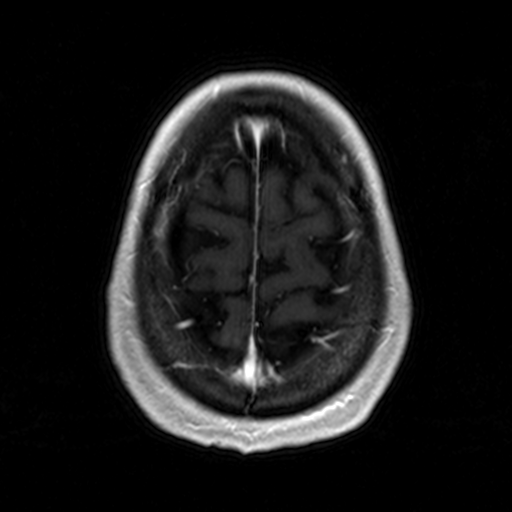

[27 of 48 positions shown; findings below may reference images not displayed]

FINDINGS: Brain: Conventional imaging the brain is unremarkable. No acute
infarct, hemorrhage, or mass lesion is present. The ventricles are
of normal size. No significant extraaxial fluid collection is
present. Minimal subcortical T2 hyperintensities are within normal
limits for age.

The postcontrast images demonstrate no pathologic enhancement of the
sella.

Dedicated imaging of the pituitary demonstrates previous
transsphenoidal resection. There is residual enhancing pituitary
tissue which measures 5 mm in cephalo caudad dimension maximally.
There is slight rightward deviation of the pituitary stalk. There is
slight downward deviation of the optic chiasm without pathologic
enhancement. No residual or recurrent pituitary mass is present. The
cavernous sinus is within normal limits bilaterally.

Vascular: Flow is present in the major intracranial arteries.

Skull and upper cervical spine: The skullbase is within normal
limits. Midline sagittal images are unremarkable.

Sinuses/Orbits: Extensive sinus opacification is present. The
maxillary sinuses are opacified bilaterally. The left maxillary
sinus is shrunken with thickening of the wall. Anterior ethmoid air
cells are opacified. The right frontal sinus is opacified. The left
frontal sinus is clear. The left sphenoid sinus is opacified. There
is fat packing in the sphenoid sinuses well following remote
surgery.
IMPRESSION: 1. Transsphenoidal resection of previous pituitary tumor without
evidence for residual or recurrent neoplasm.
2. Residual enhancing pituitary tissue is within normal limits.
3. Slight rightward deviation of the pituitary stalk likely related
to scarring and previous surgery.
4. Slight downward deviation of the optic chiasm, also likely
related to prior surgery.
5. The suprasellar brain is normal for age.
6. Extensive sinus disease as described.

## 2018-04-11 ENCOUNTER — Encounter: Payer: Self-pay | Admitting: "Endocrinology

## 2018-04-21 ENCOUNTER — Ambulatory Visit: Payer: 59 | Admitting: "Endocrinology

## 2018-04-21 ENCOUNTER — Other Ambulatory Visit: Payer: Self-pay | Admitting: "Endocrinology

## 2018-04-24 ENCOUNTER — Other Ambulatory Visit: Payer: Self-pay | Admitting: "Endocrinology

## 2018-05-16 ENCOUNTER — Other Ambulatory Visit: Payer: Self-pay | Admitting: "Endocrinology

## 2018-05-25 ENCOUNTER — Ambulatory Visit: Payer: 59 | Admitting: "Endocrinology

## 2018-06-01 ENCOUNTER — Other Ambulatory Visit: Payer: Self-pay | Admitting: "Endocrinology

## 2018-06-19 ENCOUNTER — Other Ambulatory Visit: Payer: Self-pay | Admitting: "Endocrinology

## 2018-06-29 ENCOUNTER — Other Ambulatory Visit: Payer: Self-pay | Admitting: "Endocrinology

## 2018-06-30 LAB — CMP14+EGFR
A/G RATIO: 1.5 (ref 1.2–2.2)
ALT: 29 IU/L (ref 0–44)
AST: 23 IU/L (ref 0–40)
Albumin: 4.3 g/dL (ref 3.5–5.5)
Alkaline Phosphatase: 104 IU/L (ref 39–117)
BUN/Creatinine Ratio: 14 (ref 9–20)
BUN: 19 mg/dL (ref 6–24)
Bilirubin Total: 0.3 mg/dL (ref 0.0–1.2)
CALCIUM: 9.5 mg/dL (ref 8.7–10.2)
CO2: 23 mmol/L (ref 20–29)
CREATININE: 1.36 mg/dL — AB (ref 0.76–1.27)
Chloride: 104 mmol/L (ref 96–106)
GFR, EST AFRICAN AMERICAN: 68 mL/min/{1.73_m2} (ref >59)
GFR, EST NON AFRICAN AMERICAN: 59 mL/min/{1.73_m2} — AB (ref >59)
GLUCOSE: 142 mg/dL — AB (ref 65–99)
Globulin, Total: 2.8 g/dL (ref 1.5–4.5)
Potassium: 4.6 mmol/L (ref 3.5–5.2)
Sodium: 141 mmol/L (ref 134–144)
TOTAL PROTEIN: 7.1 g/dL (ref 6.0–8.5)

## 2018-06-30 LAB — TSH: TSH: <0.006 u[IU]/mL — ABNORMAL LOW (ref 0.450–4.500)

## 2018-06-30 LAB — HGB A1C W/O EAG: Hgb A1c MFr Bld: 6.6 % — ABNORMAL HIGH (ref 4.8–5.6)

## 2018-06-30 LAB — T4, FREE: Free T4: 1.4 ng/dL (ref 0.82–1.77)

## 2018-07-03 ENCOUNTER — Ambulatory Visit (INDEPENDENT_AMBULATORY_CARE_PROVIDER_SITE_OTHER): Payer: 59 | Admitting: "Endocrinology

## 2018-07-03 ENCOUNTER — Encounter: Payer: Self-pay | Admitting: "Endocrinology

## 2018-07-03 VITALS — BP 132/81 | HR 63 | Ht 70.0 in | Wt 256.0 lb

## 2018-07-03 DIAGNOSIS — E291 Testicular hypofunction: Secondary | ICD-10-CM | POA: Diagnosis not present

## 2018-07-03 DIAGNOSIS — E1159 Type 2 diabetes mellitus with other circulatory complications: Secondary | ICD-10-CM | POA: Diagnosis not present

## 2018-07-03 DIAGNOSIS — E230 Hypopituitarism: Secondary | ICD-10-CM | POA: Diagnosis not present

## 2018-07-03 MED ORDER — TESTOSTERONE CYPIONATE 200 MG/ML IM SOLN: INTRAMUSCULAR | 1 refills | Status: DC

## 2018-07-03 NOTE — Patient Instructions (Signed)

## 2018-07-03 NOTE — Progress Notes (Signed)
Endocrinology follow-up note  Subjective:    Patient ID: Ernest Graham, male    DOB: 19-Jul-1966. Patient is being seen in Follow-up for his type 2 diabetes, panhypopituitarism including hypothyroidism, diabetes insipidus, hypogonadism. 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  He is on multiple hormone replacements including levothyroxine, hydrocortisone, desmopressin, and testosterone.  He is also being treated for type 2 diabetes with metformin and Trulicity.    -He has achieved better control of diabetes with recent A1c of 6.6%.  He has a steady weight since last visit,  reports improvement in his dietary restrictions.    He denies heat or cold intolerance. He denies polyuria, nocturia, nor recent hospitalizations.  Past Medical History:  Diagnosis Date  . Adrenal disease ([place])   . Diabetes mellitus, type II ([place])   . Heart attack ([place])   . Hyperlipidemia    Past Surgical History:  Procedure Laterality Date  . CORONARY ARTERY BYPASS GRAFT    . MITRAL VALVE REPAIR    . Removal of Cricopharyngeoma     Social History   Socioeconomic History  . Marital status: Married    Spouse name: Not on file  . Number of children: Not on file  . Years of education: Not on file  . Highest education level: Not on file  Occupational History  . Not on file  Social Needs  . Financial resource strain: Not on file  . Food insecurity:    Worry: Not on file    Inability: Not on file  . Transportation needs:    Medical: Not on file    Non-medical: Not on file  Tobacco Use  . Smoking status: Former Research scientist (life sciences)  . Smokeless tobacco: Never Used  Substance and Sexual Activity  . Alcohol use: No    Alcohol/week: 0.0 standard drinks  . Drug use: No  . Sexual activity: Not on file  Lifestyle  . Physical activity:    Days per week: Not on file    Minutes per session: Not on file  . Stress: Not on file  Relationships   . Social connections:    Talks on phone: Not on file    Gets together: Not on file    Attends religious service: Not on file    Active member of club or organization: Not on file    Attends meetings of clubs or organizations: Not on file    Relationship status: Not on file  Other Topics Concern  . Not on file  Social History Narrative  . Not on file   Outpatient Encounter Medications as of 07/03/2018  Medication Sig  . aspirin 325 MG tablet Take by mouth daily.   Marland Kitchen atorvastatin (LIPITOR) 20 MG tablet TAKE 1 TABLET BY MOUTH EVERY DAY  . busPIRone (BUSPAR) 15 MG tablet Take 15 mg by mouth 2 (two) times daily.  . clonazePAM (KLONOPIN) 0.5 MG tablet Take 0.5 mg by mouth 3 (three) times daily as needed for anxiety.  . [facility] D3 5000 units capsule TAKE ONE CAPSULE BY MOUTH EVERY DAY  . desmopressin (DDAVP) 0.1 MG tablet 1 qam & 2 tabs qhs  . FLUoxetine (PROZAC) 20 MG tablet Take 20 mg by mouth daily.  . hydrocortisone (CORTEF) 5 MG tablet TAKE 2 TABLETS (10 MG TOTAL) BY MOUTH 2 (TWO) TIMES DAILY.  Marland Kitchen  levothyroxine (SYNTHROID, LEVOTHROID) 112 MCG tablet Take 1 tablet (112 mcg total) by mouth daily before breakfast.  . metFORMIN (GLUCOPHAGE) 500 MG tablet TAKE 1 TABLET BY MOUTH TWICE A DAY WITH A MEAL  . potassium chloride SA (K-DUR,KLOR-CON) 20 MEQ tablet Take 20 mEq by mouth daily.  Marland Kitchen testosterone cypionate (DEPOTESTOSTERONE CYPIONATE) 200 MG/ML injection INJECT 0.5 MLS INTO THE MUSCLE EVERY 14 DAYS  . TRULICITY 2.33 ID/5.6YS SOPN INJECT 0.75 MG(0.5ML) INTO THE SKIN ONCE A WEEK.  . [DISCONTINUED] desmopressin (DDAVP) 0.1 MG tablet TAKE ONE TAB BY MOUTH EVERY MORNING AND 2 TABS AT BEDTIME  . [DISCONTINUED] testosterone cypionate (DEPOTESTOSTERONE CYPIONATE) 200 MG/ML injection INJECT 0.5 MLS INTO THE MUSCLE EVERY 14 DAYS (Patient taking differently: INJECT 0.5 MLS INTO THE MUSCLE EVERY week)   No facility-administered encounter medications on file as of 07/03/2018.    ALLERGIES: No Known  Allergies VACCINATION STATUS:  There is no immunization history on file for this patient.  Diabetes  He presents for his follow-up diabetic visit. He has type 2 diabetes mellitus. Onset time: He was diagnosed at approximate age of 65 years. His disease course has been stable. Pertinent negatives for hypoglycemia include no confusion, headaches, pallor or seizures. Pertinent negatives for diabetes include no chest pain, no fatigue, no polydipsia, no polyphagia, no polyuria and no weakness. There are no hypoglycemic complications. Symptoms are stable. There are no diabetic complications. (Triple bypass and mitral valve repair.) Risk factors for coronary artery disease include diabetes mellitus, dyslipidemia, hypertension, obesity, tobacco exposure and sedentary lifestyle. His weight is stable. He is following a generally unhealthy diet. When asked about meal planning, he reported none. He has not had a previous visit with a dietitian. He never participates in exercise. An ACE inhibitor/angiotensin II receptor blocker is being taken.  Hyperlipidemia  This is a chronic problem. The current episode started more than 1 year ago. The problem is uncontrolled. Exacerbating diseases include diabetes, hypothyroidism and obesity. Pertinent negatives include no chest pain, myalgias or shortness of breath. Current antihyperlipidemic treatment includes statins. Risk factors for coronary artery disease include diabetes mellitus, dyslipidemia, hypertension, male sex, obesity and a sedentary lifestyle.  Hypertension  This is a chronic problem. The current episode started more than 1 year ago. The problem is controlled. Pertinent negatives include no chest pain, headaches, neck pain, palpitations or shortness of breath. Risk factors for coronary artery disease include diabetes mellitus, dyslipidemia, male gender, obesity, smoking/tobacco exposure and sedentary lifestyle. Past treatments include ACE inhibitors. Hypertensive  end-organ damage includes kidney disease and CAD/MI.    Review of Systems  Constitutional: Negative for chills, fatigue, fever and unexpected weight change.  HENT: Negative for dental problem, mouth sores and trouble swallowing.   Eyes: Negative for visual disturbance.  Respiratory: Negative for cough, choking, chest tightness, shortness of breath and wheezing.   Cardiovascular: Negative for chest pain, palpitations and leg swelling.  Gastrointestinal: Negative for abdominal distention, abdominal pain, constipation, diarrhea, nausea and vomiting.  Endocrine: Negative for polydipsia, polyphagia and polyuria.  Genitourinary: Negative for dysuria, flank pain, hematuria and urgency.  Musculoskeletal: Negative for back pain, gait problem, myalgias and neck pain.  Skin: Negative for pallor, rash and wound.  Neurological: Negative for seizures, syncope, weakness, numbness and headaches.  Psychiatric/Behavioral: Negative.  Negative for confusion and dysphoric mood.    Objective:    BP 132/81   Pulse 63   Ht _0  (1.778 m)   Wt 256 lb (116.1 kg)   BMI 36.73 kg/m  Wt Readings from Last 3 Encounters:  07/03/18 256 lb (116.1 kg)  12/20/17 256 lb (116.1 kg)  09/20/17 265 lb (120.2 kg)    Physical Exam  Constitutional: He is oriented to person, place, and time. He appears well-developed. He is cooperative. No distress.  HENT:  Head: Normocephalic and atraumatic.  Eyes: EOM are normal.  Neck: Normal range of motion. Neck supple. No tracheal deviation present. No thyromegaly present.  Cardiovascular: Normal rate, S1 normal and S2 normal. Exam reveals no gallop.  No murmur heard. Pulses:      Dorsalis pedis pulses are 1+ on the right side, and 1+ on the left side.       Posterior tibial pulses are 1+ on the right side, and 1+ on the left side.  Pulmonary/Chest: Effort normal. No respiratory distress. He has no wheezes.  Abdominal: He exhibits no distension. There is no tenderness. There  is no guarding and no CVA tenderness.  Musculoskeletal: He exhibits no edema.       Right shoulder: He exhibits no swelling and no deformity.  Neurological: He is alert and oriented to person, place, and time. He has normal strength. No cranial nerve deficit or sensory deficit. Gait normal.  Skin: Skin is warm and dry. No rash noted. No cyanosis. Nails show no clubbing.  Psychiatric: He has a normal mood and affect. His speech is normal. Judgment normal. Cognition and memory are normal.    - Summary of his pituitary/sella MRI from 08/13/2016 as follows - IMPRESSION: 1. Transsphenoidal resection of previous pituitary tumor without evidence for residual or recurrent neoplasm. 2. Residual enhancing pituitary tissue is within normal limits. 3. Slight rightward deviation of the pituitary stalk likely related to scarring and previous surgery. 4. Slight downward deviation of the optic chiasm, also likely related to prior surgery. 5. The suprasellar brain is normal for age. 6. Extensive sinus disease as described.  Recent Results (from the past 2160 hour(s))  CMP14+EGFR     Status: Abnormal   Collection Time: 06/29/18  8:56 AM  Result Value Ref Range   Glucose 142 (H) 65 - 99 mg/dL   BUN 19 6 - 24 mg/dL   Creatinine, Ser 1.36 (H) 0.76 - 1.27 mg/dL   GFR calc non Af Amer 59 (L) >59 mL/min/1.73   GFR calc Af Amer 68 >59 mL/min/1.73   BUN/Creatinine Ratio 14 9 - 20   Sodium 141 134 - 144 mmol/L   Potassium 4.6 3.5 - 5.2 mmol/L   Chloride 104 96 - 106 mmol/L   CO2 23 20 - 29 mmol/L   Calcium 9.5 8.7 - 10.2 mg/dL   Total Protein 7.1 6.0 - 8.5 g/dL   Albumin 4.3 3.5 - 5.5 g/dL   Globulin, Total 2.8 1.5 - 4.5 g/dL   Albumin/Globulin Ratio 1.5 1.2 - 2.2   Bilirubin Total 0.3 0.0 - 1.2 mg/dL   Alkaline Phosphatase 104 39 - 117 IU/L   AST 23 0 - 40 IU/L   ALT 29 0 - 44 IU/L  Hgb A1c w/o eAG     Status: Abnormal   Collection Time: 06/29/18  8:56 AM  Result Value Ref Range   Hgb A1c MFr Bld  6.6 (H) 4.8 - 5.6 %    Comment:          Prediabetes: 5.7 - 6.4          Diabetes: >6.4          Glycemic control for adults with diabetes: <7.0  T4, free     Status: None   Collection Time: 06/29/18  8:56 AM  Result Value Ref Range   Free T4 1.40 0.82 - 1.77 ng/dL  TSH     Status: Abnormal   Collection Time: 06/29/18  8:56 AM  Result Value Ref Range   TSH <0.006 (L) 0.450 - 4.500 uIU/mL   Lipid Panel     Component Value Date/Time   CHOL 182 08/12/2016 1018   TRIG 341 (H) 08/12/2016 1018   HDL 28 (L) 08/12/2016 1018   CHOLHDL 6.5 (H) 08/12/2016 1018   LDLCALC 86 08/12/2016 1018    Assessment & Plan:   1. 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 Hyperlipidemia 3.hypertension 4. Panhypopituitarism : Diabetes insipidus, adrenal insufficiency, hypogonadism, hypothyroidism 5. Vitamin D deficiency  - Patient has currently controlled asymptomatic type 2 DM since  52 years of age,  with most recent A1c of 6.5% , generally improving from 10.4% Recent labs reviewed.   His diabetes is complicated by coronary artery disease status post triple bypass and mitral valve repair and patient remains at a high risk for more acute and chronic complications of diabetes which include CAD, CVA, CKD, retinopathy, and neuropathy. These are all discussed in detail with the patient.  - I have counseled the patient on diet management and weight loss, by adopting a carbohydrate restricted/protein rich diet.  -  Suggestion is made for him to avoid simple carbohydrates  from his diet including Cakes, Sweet Desserts / Pastries, Ice Cream, Soda (diet and regular), Sweet Tea, Candies, Chips, Cookies, Store Bought Juices, Alcohol in Excess of  1-2 drinks a day, Artificial Sweeteners, and "Sugar-free" Products. This will help patient to have stable blood glucose profile and potentially avoid unintended weight gain.   - I encouraged the patient to switch to  unprocessed or minimally processed complex  starch and increased protein intake (animal or plant source), fruits, and vegetables.  - Patient is advised to stick to a routine mealtimes to eat 3 meals  a day and avoid unnecessary snacks ( to snack only to correct hypoglycemia).   - I have approached patient with the following individualized plan to manage diabetes and patient agrees:  -He continued to do better with metformin and Trulicity.   -He is advised to continue Trulicity 7.42 mg subcutaneously weekly, metformin 500 mg p.o. twice daily.  These medications are suitable for him at this time. -He will not require insulin treatment for now.  - Patient specific target  A1c;  LDL, HDL, Triglycerides, and  Waist Circumference were discussed in detail.  2) BP/HTN:   His blood pressure is controlled to target. 3) Lipids/HPL:  Uncontrolled, LDL at 86 with hypertriglyceridemia.  He is advised to continue atorvastatin 20 mg p.o. nightly.   4)  Weight/Diet:  CDE Consult has been requested, exercise, and detailed carbohydrates information provided.  6) Panhypopituitarism: This is from surgery for craniopharyngioma at age 48 at [facility] [place]. He is on multiple hormonal replacements including levothyroxine 112 g by mouth every morning, hydrocortisone 10 mg by mouth twice a day, desmopressin 0.1 mg by mouth twice a day, and testosterone 100 mg IM every 7 days. -  His recent labs show total testosterone at 473 improving from 89 due to his more consistent compliance with testosterone injections.   -He is advised to continue testosterone 100 mg IM every 7 days, his PSA is favorable at 0.2, hematocrit 47.6. - His repeat MRI of sella/pituitary in 2017-  is consistent with postsurgical changes with no new lesion. Summaries dictated,  see above.   - He is status post sinus drainage by ENT with clinical improvement. He does not have any clinical complaints of mass-effect at this time.   5) Chronic Care/Health Maintenance:  -Patient  On Statin  medications and encouraged to continue to follow up with Ophthalmology, Podiatrist at least yearly or according to recommendations, and advised to   stay away from smoking. I have recommended yearly flu vaccine and pneumonia vaccination at least every 5 years; moderate intensity exercise for up to 150 minutes weekly; and  sleep for at least 7 hours a day.  - He he is advised to  resume vitamin D replacement with 5000 units of vitamin D3 daily for 90 days.   - I advised patient to maintain close follow up with Earney Mallet, MD for primary care needs.   - Time spent with the patient: 25 min, of which >50% was spent in reviewing his  current and  previous labs, previous treatments, and medications doses and developing a plan for long-term care.  Raymond Gurney participated in the discussions, expressed understanding, and voiced agreement with the above plans.  All questions were answered to his satisfaction. he is encouraged to contact clinic should he have any questions or concerns prior to his return visit.   Follow up plan: - Return in about 4 months (around 11/03/2018) for Follow up with Pre-visit Labs.  Glade Lloyd, MD Phone: [phone]  Fax: [phone]  This note was partially dictated with voice recognition software. Similar sounding words can be transcribed inadequately or may not  be corrected upon review.  07/03/2018, 12:27 PM

## 2018-07-06 ENCOUNTER — Other Ambulatory Visit: Payer: Self-pay | Admitting: "Endocrinology

## 2018-08-05 ENCOUNTER — Other Ambulatory Visit: Payer: Self-pay | Admitting: "Endocrinology

## 2018-10-26 ENCOUNTER — Other Ambulatory Visit: Payer: Self-pay | Admitting: "Endocrinology

## 2018-10-30 ENCOUNTER — Other Ambulatory Visit: Payer: Self-pay | Admitting: "Endocrinology

## 2018-11-03 ENCOUNTER — Ambulatory Visit (INDEPENDENT_AMBULATORY_CARE_PROVIDER_SITE_OTHER): Payer: 59 | Admitting: "Endocrinology

## 2018-11-03 ENCOUNTER — Encounter: Payer: Self-pay | Admitting: "Endocrinology

## 2018-11-03 VITALS — BP 125/85 | HR 82 | Ht 70.0 in | Wt 258.0 lb

## 2018-11-03 DIAGNOSIS — E1159 Type 2 diabetes mellitus with other circulatory complications: Secondary | ICD-10-CM | POA: Diagnosis not present

## 2018-11-03 DIAGNOSIS — E230 Hypopituitarism: Secondary | ICD-10-CM

## 2018-11-03 DIAGNOSIS — E559 Vitamin D deficiency, unspecified: Secondary | ICD-10-CM

## 2018-11-03 DIAGNOSIS — E291 Testicular hypofunction: Secondary | ICD-10-CM | POA: Diagnosis not present

## 2018-11-03 LAB — CBC/DIFF AMBIGUOUS DEFAULT
BASOS ABS: 0.1 10*3/uL (ref 0.0–0.2)
Basos: 1 %
EOS (ABSOLUTE): 0.1 10*3/uL (ref 0.0–0.4)
Eos: 2 %
Hematocrit: 46.7 % (ref 37.5–51.0)
Hemoglobin: 15.7 g/dL (ref 13.0–17.7)
Immature Grans (Abs): 0 10*3/uL (ref 0.0–0.1)
Immature Granulocytes: 0 %
LYMPHS ABS: 3.3 10*3/uL — AB (ref 0.7–3.1)
Lymphs: 45 %
MCH: 29.9 pg (ref 26.6–33.0)
MCHC: 33.6 g/dL (ref 31.5–35.7)
MCV: 89 fL (ref 79–97)
Monocytes Absolute: 0.6 10*3/uL (ref 0.1–0.9)
Monocytes: 9 %
NEUTROS ABS: 3.1 10*3/uL (ref 1.4–7.0)
Neutrophils: 43 %
Platelets: 218 10*3/uL (ref 150–450)
RBC: 5.25 x10E6/uL (ref 4.14–5.80)
RDW: 12 % (ref 11.6–15.4)
WBC: 7.3 10*3/uL (ref 3.4–10.8)

## 2018-11-03 LAB — TESTOSTERONE, FREE AND TOTAL (INCLUDES SHBG)-(MALES)
% Free Testosterone: 1.7 %
Free Testosterone, S: 68 pg/mL
SEX HORMONE BINDING GLOBULIN: 29.9 nmol/L
TESTOSTERONE, SERUM (TOTAL): 399 ng/dL

## 2018-11-03 LAB — COMPREHENSIVE METABOLIC PANEL
ALBUMIN: 3.9 g/dL (ref 3.8–4.9)
ALT: 24 IU/L (ref 0–44)
AST: 11 IU/L (ref 0–40)
Albumin/Globulin Ratio: 1.5 (ref 1.2–2.2)
Alkaline Phosphatase: 142 IU/L — ABNORMAL HIGH (ref 39–117)
BUN / CREAT RATIO: 7 — AB (ref 9–20)
BUN: 9 mg/dL (ref 6–24)
Bilirubin Total: 0.3 mg/dL (ref 0.0–1.2)
CO2: 19 mmol/L — AB (ref 20–29)
CREATININE: 1.25 mg/dL (ref 0.76–1.27)
Calcium: 9.5 mg/dL (ref 8.7–10.2)
Chloride: 102 mmol/L (ref 96–106)
GFR calc non Af Amer: 65 mL/min/{1.73_m2} (ref >59)
GFR, EST AFRICAN AMERICAN: 76 mL/min/{1.73_m2} (ref >59)
GLOBULIN, TOTAL: 2.6 g/dL (ref 1.5–4.5)
Glucose: 272 mg/dL — ABNORMAL HIGH (ref 65–99)
Potassium: 4 mmol/L (ref 3.5–5.2)
SODIUM: 136 mmol/L (ref 134–144)
TOTAL PROTEIN: 6.5 g/dL (ref 6.0–8.5)

## 2018-11-03 LAB — PROLACTIN: Prolactin: 11.6 ng/mL (ref 4.0–15.2)

## 2018-11-03 LAB — T4, FREE: FREE T4: 1.4 ng/dL (ref 0.82–1.77)

## 2018-11-03 LAB — PSA: Prostate Specific Ag, Serum: 0.2 ng/mL (ref 0.0–4.0)

## 2018-11-03 LAB — VITAMIN D 25 HYDROXY (VIT D DEFICIENCY, FRACTURES): Vit D, 25-Hydroxy: 19.7 ng/mL — ABNORMAL LOW (ref 30.0–100.0)

## 2018-11-03 LAB — SPECIMEN STATUS REPORT

## 2018-11-03 LAB — HGB A1C W/O EAG: Hgb A1c MFr Bld: 8 % — ABNORMAL HIGH (ref 4.8–5.6)

## 2018-11-03 MED ORDER — "SYRINGE/NEEDLE (DISP) 21G X 1-1/2"" 3 ML MISC": 0 refills | Status: DC

## 2018-11-03 MED ORDER — DULAGLUTIDE 1.5 MG/0.5ML ~~LOC~~ SOAJ: 1.5000 mg | SUBCUTANEOUS | 5 refills | Status: DC

## 2018-11-03 MED ORDER — TESTOSTERONE CYPIONATE 200 MG/ML IM SOLN: INTRAMUSCULAR | 1 refills | Status: DC

## 2018-11-03 MED ORDER — CHOLECALCIFEROL 125 MCG (5000 UT) PO CAPS: 5000.0000 [IU] | ORAL_CAPSULE | Freq: Every day | ORAL | 1 refills | Status: DC

## 2018-11-03 NOTE — Patient Instructions (Signed)

## 2018-11-03 NOTE — Progress Notes (Signed)
Endocrinology follow-up note  Subjective:    Patient ID: Ernest Graham, male    DOB: [birth year]. Patient is being seen in Follow-up for his type 2 diabetes, panhypopituitarism including hypothyroidism, diabetes insipidus, hypogonadism. 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  He is on multiple hormone replacements including levothyroxine, hydrocortisone, desmopressin, and testosterone.  He is also being treated for type 2 diabetes with metformin and Trulicity.    -He has achieved better control of diabetes with recent A1c of 6.6%.  He has a steady weight since last visit,  reports improvement in his dietary restrictions.    He denies heat or cold intolerance. He denies polyuria, nocturia, nor recent hospitalizations.  Past Medical History:  Diagnosis Date  . Adrenal disease (HCC)   . Diabetes mellitus, type II (HCC)   . Heart attack (HCC)   . Hyperlipidemia    Past Surgical History:  Procedure Laterality Date  . CORONARY ARTERY BYPASS GRAFT    . MITRAL VALVE REPAIR    . Removal of Cricopharyngeoma     Social History   Socioeconomic History  . Marital status: Married    Spouse name: Not on file  . Number of children: Not on file  . Years of education: Not on file  . Highest education level: Not on file  Occupational History  . Not on file  Social Needs  . Financial resource strain: Not on file  . Food insecurity:    Worry: Not on file    Inability: Not on file  . Transportation needs:    Medical: Not on file    Non-medical: Not on file  Tobacco Use  . Smoking status: Former Games developer  . Smokeless tobacco: Never Used  Substance and Sexual Activity  . Alcohol use: No    Alcohol/week: 0.0 standard drinks  . Drug use: No  . Sexual activity: Not on file  Lifestyle  . Physical activity:    Days per week: Not on file    Minutes per session: Not on file  . Stress: Not on file  Relationships   . Social connections:    Talks on phone: Not on file    Gets together: Not on file    Attends religious service: Not on file    Active member of club or organization: Not on file    Attends meetings of clubs or organizations: Not on file    Relationship status: Not on file  Other Topics Concern  . Not on file  Social History Narrative  . Not on file   Outpatient Encounter Medications as of 11/03/2018  Medication Sig  . aspirin 325 MG tablet Take by mouth daily.   Marland Kitchen atorvastatin (LIPITOR) 20 MG tablet TAKE 1 TABLET BY MOUTH EVERY DAY  . busPIRone (BUSPAR) 15 MG tablet Take 15 mg by mouth 2 (two) times daily.  . Cholecalciferol (CVS D3) 125 MCG (5000 UT) capsule Take 1 capsule (5,000 Units total) by mouth daily.  . clonazePAM (KLONOPIN) 0.5 MG tablet Take 0.5 mg by mouth 3 (three) times daily as needed for anxiety.  Marland Kitchen desmopressin (DDAVP) 0.1 MG tablet 1 qam & 2 tabs qhs  . Dulaglutide (TRULICITY) 1.5 MG/0.5ML SOPN Inject 1.5 mg into the skin once a week.  Marland Kitchen FLUoxetine (PROZAC) 20 MG tablet Take 20 mg by mouth daily.  Marland Kitchen  hydrocortisone (CORTEF) 5 MG tablet TAKE 2 TABLETS (10 MG TOTAL) BY MOUTH 2 (TWO) TIMES DAILY.  Marland Kitchen levothyroxine (SYNTHROID, LEVOTHROID) 112 MCG tablet Take 1 tablet (112 mcg total) by mouth daily before breakfast.  . metFORMIN (GLUCOPHAGE) 500 MG tablet TAKE 1 TABLET BY MOUTH TWICE A DAY WITH A MEAL  . potassium chloride SA (K-DUR,KLOR-CON) 20 MEQ tablet Take 20 mEq by mouth daily.  . SYRINGE-NEEDLE, DISP, 3 ML 21G X 1-1/2" 3 ML MISC Use to inject testosterone every week  . testosterone cypionate (DEPOTESTOSTERONE CYPIONATE) 200 MG/ML injection INJECT 0.5 MLS INTO THE MUSCLE EVERY 14 DAYS  . [DISCONTINUED] CVS D3 5000 units capsule TAKE ONE CAPSULE BY MOUTH EVERY DAY  . [DISCONTINUED] testosterone cypionate (DEPOTESTOSTERONE CYPIONATE) 200 MG/ML injection INJECT 0.5 MLS INTO THE MUSCLE EVERY 14 DAYS  . [DISCONTINUED] TRULICITY 0.75 MG/0.5ML SOPN INJECT 0.75 MG(0.5ML) INTO  THE SKIN ONCE A WEEK.   No facility-administered encounter medications on file as of 11/03/2018.    ALLERGIES: No Known Allergies VACCINATION STATUS:  There is no immunization history on file for this patient.  Diabetes  He presents for his follow-up diabetic visit. He has type 2 diabetes mellitus. Onset time: He was diagnosed at approximate age of 53 years. His disease course has been worsening. Pertinent negatives for hypoglycemia include no confusion, headaches, pallor or seizures. Pertinent negatives for diabetes include no chest pain, no fatigue, no polydipsia, no polyphagia, no polyuria and no weakness. There are no hypoglycemic complications. Symptoms are worsening. There are no diabetic complications. (Triple bypass and mitral valve repair.) Risk factors for coronary artery disease include diabetes mellitus, dyslipidemia, hypertension, obesity, tobacco exposure and sedentary lifestyle. Current diabetic treatments: Due to insurance lapse, he did not have coverage for Trulicity for 2 months since last visit. His weight is fluctuating minimally. He is following a generally unhealthy diet. When asked about meal planning, he reported none. He has not had a previous visit with a dietitian. He never participates in exercise. An ACE inhibitor/angiotensin II receptor blocker is being taken.  Hyperlipidemia  This is a chronic problem. The current episode started more than 1 year ago. The problem is uncontrolled. Exacerbating diseases include diabetes, hypothyroidism and obesity. Pertinent negatives include no chest pain, myalgias or shortness of breath. Current antihyperlipidemic treatment includes statins. Risk factors for coronary artery disease include diabetes mellitus, dyslipidemia, hypertension, male sex, obesity and a sedentary lifestyle.  Hypertension  This is a chronic problem. The current episode started more than 1 year ago. The problem is controlled. Pertinent negatives include no chest pain,  headaches, neck pain, palpitations or shortness of breath. Risk factors for coronary artery disease include diabetes mellitus, dyslipidemia, male gender, obesity, smoking/tobacco exposure and sedentary lifestyle. Past treatments include ACE inhibitors. Hypertensive end-organ damage includes kidney disease and CAD/MI.    Review of Systems  Constitutional: Negative for chills, fatigue, fever and unexpected weight change.  HENT: Negative for dental problem, mouth sores and trouble swallowing.   Eyes: Negative for visual disturbance.  Respiratory: Negative for cough, choking, chest tightness, shortness of breath and wheezing.   Cardiovascular: Negative for chest pain, palpitations and leg swelling.  Gastrointestinal: Negative for abdominal distention, abdominal pain, constipation, diarrhea, nausea and vomiting.  Endocrine: Negative for polydipsia, polyphagia and polyuria.  Genitourinary: Negative for dysuria, flank pain, hematuria and urgency.  Musculoskeletal: Negative for back pain, gait problem, myalgias and neck pain.  Skin: Negative for pallor, rash and wound.  Neurological: Negative for seizures, syncope, weakness, numbness and headaches.  Psychiatric/Behavioral: Negative for confusion and dysphoric mood.    Objective:    BP 125/85   Pulse 82   Ht 5\' 10"  (1.778 m)   Wt 258 lb (117 kg)   BMI 37.02 kg/m   Wt Readings from Last 3 Encounters:  11/03/18 258 lb (117 kg)  07/03/18 256 lb (116.1 kg)  12/20/17 256 lb (116.1 kg)    Physical Exam  Constitutional: He is oriented to person, place, and time. He appears well-developed. He is cooperative. No distress.  HENT:  Head: Normocephalic and atraumatic.  Eyes: EOM are normal.  Neck: Normal range of motion. Neck supple. No tracheal deviation present. No thyromegaly present.  Cardiovascular: Normal rate, S1 normal and S2 normal. Exam reveals no gallop.  No murmur heard. Pulses:      Dorsalis pedis pulses are 1+ on the right side and  1+ on the left side.       Posterior tibial pulses are 1+ on the right side and 1+ on the left side.  Pulmonary/Chest: Effort normal. No respiratory distress. He has no wheezes.  Abdominal: He exhibits no distension. There is no abdominal tenderness. There is no guarding and no CVA tenderness.  Musculoskeletal:        General: No edema.     Right shoulder: He exhibits no swelling and no deformity.  Neurological: He is alert and oriented to person, place, and time. He has normal strength. No cranial nerve deficit or sensory deficit. Gait normal.  Skin: Skin is warm and dry. No rash noted. No cyanosis. Nails show no clubbing.  Psychiatric: He has a normal mood and affect. His speech is normal. Judgment normal. Cognition and memory are normal.    - Summary of his pituitary/sella MRI from 08/13/2016 as follows - IMPRESSION: 1. Transsphenoidal resection of previous pituitary tumor without evidence for residual or recurrent neoplasm. 2. Residual enhancing pituitary tissue is within normal limits. 3. Slight rightward deviation of the pituitary stalk likely related to scarring and previous surgery. 4. Slight downward deviation of the optic chiasm, also likely related to prior surgery. 5. The suprasellar brain is normal for age. 6. Extensive sinus disease as described.  Recent Results (from the past 2160 hour(s))  CBC/Diff Ambiguous Default     Status: Abnormal   Collection Time: 10/30/18  8:26 AM  Result Value Ref Range   WBC 7.3 3.4 - 10.8 x10E3/uL   RBC 5.25 4.14 - 5.80 x10E6/uL   Hemoglobin 15.7 13.0 - 17.7 g/dL   Hematocrit 51.8 84.1 - 51.0 %   MCV 89 79 - 97 fL   MCH 29.9 26.6 - 33.0 pg   MCHC 33.6 31.5 - 35.7 g/dL   RDW 66.0 63.0 - 16.0 %   Platelets 218 150 - 450 x10E3/uL   Neutrophils 43 Not Estab. %   Lymphs 45 Not Estab. %   Monocytes 9 Not Estab. %   Eos 2 Not Estab. %   Basos 1 Not Estab. %   Neutrophils Absolute 3.1 1.4 - 7.0 x10E3/uL   Lymphocytes Absolute 3.3 (H) 0.7  - 3.1 x10E3/uL   Monocytes Absolute 0.6 0.1 - 0.9 x10E3/uL   EOS (ABSOLUTE) 0.1 0.0 - 0.4 x10E3/uL   Basophils Absolute 0.1 0.0 - 0.2 x10E3/uL   Immature Granulocytes 0 Not Estab. %   Immature Grans (Abs) 0.0 0.0 - 0.1 x10E3/uL    Comment: A hand-written panel/profile was received from your office. In accordance with the [facility] Ambiguous Test Code Policy dated July 2003, we  have assigned CBC with Differential/Platelet, Test Code #005009 to this request. If this is not the testing you wished to receive on this specimen, please contact the [facility] Client Inquiry/ Technical Services Department to clarify the test order. We appreciate your business.   Comprehensive metabolic panel     Status: Abnormal   Collection Time: 10/30/18  8:26 AM  Result Value Ref Range   Glucose 272 (H) 65 - 99 mg/dL   BUN 9 6 - 24 mg/dL   Creatinine, Ser 1.611.25 0.76 - 1.27 mg/dL   GFR calc non Af Amer 65 >59 mL/min/1.73   GFR calc Af Amer 76 >59 mL/min/1.73   BUN/Creatinine Ratio 7 (L) 9 - 20   Sodium 136 134 - 144 mmol/L   Potassium 4.0 3.5 - 5.2 mmol/L   Chloride 102 96 - 106 mmol/L   CO2 19 (L) 20 - 29 mmol/L   Calcium 9.5 8.7 - 10.2 mg/dL   Total Protein 6.5 6.0 - 8.5 g/dL   Albumin 3.9 3.8 - 4.9 g/dL    Comment:               **Please note reference interval change**   Globulin, Total 2.6 1.5 - 4.5 g/dL   Albumin/Globulin Ratio 1.5 1.2 - 2.2   Bilirubin Total 0.3 0.0 - 1.2 mg/dL   Alkaline Phosphatase 142 (H) 39 - 117 IU/L   AST 11 0 - 40 IU/L   ALT 24 0 - 44 IU/L  Testosterone Free with SHBG     Status: None (Preliminary result)   Collection Time: 10/30/18  8:26 AM  Result Value Ref Range   Testosterone, Serum (Total) WILL FOLLOW    % Free Testosterone WILL FOLLOW    Free Testosterone, S WILL FOLLOW    Sex Hormone Binding Globulin 29.9 nmol/L    Comment: Reference Range: >49y: 19.3 - 76.4   Hgb A1c w/o eAG     Status: Abnormal   Collection Time: 10/30/18  8:26 AM  Result Value Ref Range    Hgb A1c MFr Bld 8.0 (H) 4.8 - 5.6 %    Comment:          Prediabetes: 5.7 - 6.4          Diabetes: >6.4          Glycemic control for adults with diabetes: <7.0   T4, free     Status: None   Collection Time: 10/30/18  8:26 AM  Result Value Ref Range   Free T4 1.40 0.82 - 1.77 ng/dL  Prolactin     Status: None   Collection Time: 10/30/18  8:26 AM  Result Value Ref Range   Prolactin 11.6 4.0 - 15.2 ng/mL  PSA     Status: None   Collection Time: 10/30/18  8:26 AM  Result Value Ref Range   Prostate Specific Ag, Serum 0.2 0.0 - 4.0 ng/mL    Comment: Roche ECLIA methodology. According to the American Urological Association, Serum PSA should decrease and remain at undetectable levels after radical prostatectomy. The AUA defines biochemical recurrence as an initial PSA value 0.2 ng/mL or greater followed by a subsequent confirmatory PSA value 0.2 ng/mL or greater. Values obtained with different assay methods or kits cannot be used interchangeably. Results cannot be interpreted as absolute evidence of the presence or absence of malignant disease.   VITAMIN D 25 Hydroxy (Vit-D Deficiency, Fractures)     Status: Abnormal   Collection Time: 10/30/18  8:26 AM  Result Value Ref  Range   Vit D, 25-Hydroxy 19.7 (L) 30.0 - 100.0 ng/mL    Comment: Vitamin D deficiency has been defined by the Institute of Medicine and an Endocrine Society practice guideline as a level of serum 25-OH vitamin D less than 20 ng/mL (1,2). The Endocrine Society went on to further define vitamin D insufficiency as a level between 21 and 29 ng/mL (2). 1. IOM (Institute of Medicine). 2010. Dietary reference    intakes for calcium and D. [place]: The    [employer]. 2. Holick MF, Binkley Thorp, Bischoff-Ferrari HA, et al.    Evaluation, treatment, and prevention of vitamin D    deficiency: an Endocrine Society clinical practice    guideline. JCEM. 2011 Jul; 96(7):1911-30.   Specimen status report      Status: None   Collection Time: 10/30/18  8:26 AM  Result Value Ref Range   specimen status report Comment     Comment: Gloris Manchester CMP14 Default Gloris Manchester CMP14 Default A hand-written panel/profile was received from your office. In accordance with the [facility] Ambiguous Test Code Policy dated July 2003, we have completed your order by using the closest currently or formerly recognized AMA panel.  We have assigned Comprehensive Metabolic Panel (14), Test Code #322000 to this request.  If this is not the testing you wished to receive on this specimen, please contact the [facility] Client Inquiry/Technical Services Department to clarify the test order.  We appreciate your business.    Lipid Panel     Component Value Date/Time   CHOL 182 08/12/2016 1018   TRIG 341 (H) 08/12/2016 1018   HDL 28 (L) 08/12/2016 1018   CHOLHDL 6.5 (H) 08/12/2016 1018   LDLCALC 86 08/12/2016 1018    Assessment & Plan:   1.type 2 diabetes with vascular complications 2.hyperlipidemia 3.hypertension 4.panhypopituitarism: Diabetes insipidus, adrenal insufficiency, hypogonadism, hypothyroidism 5.vitamin D deficiency  - Patient has currently controlled asymptomatic type 2 DM since  53 years of age,  with most recent A1c of 8% increasing from 6.6%.  This is mainly due to interruption of his Trulicity in the interim due to lapse in his coverage.    His diabetes is complicated by coronary artery disease status post triple bypass and mitral valve repair and patient remains at a high risk for more acute and chronic complications of diabetes which include CAD, CVA, CKD, retinopathy, and neuropathy. These are all discussed in detail with the patient.  - I have counseled the patient on diet management and weight loss, by adopting a carbohydrate restricted/protein rich diet.  - Patient admits there is a room for improvement in his diet and drink choices. -  Suggestion is made for him to avoid simple carbohydrates   from his diet including Cakes, Sweet Desserts / Pastries, Ice Cream, Soda (diet and regular), Sweet Tea, Candies, Chips, Cookies, Store Bought Juices, Alcohol in Excess of  1-2 drinks a day, Artificial Sweeteners, and "Sugar-free" Products. This will help patient to have stable blood glucose profile and potentially avoid unintended weight gain.   - I encouraged the patient to switch to  unprocessed or minimally processed complex starch and increased protein intake (animal or plant source), fruits, and vegetables.  - Patient is advised to stick to a routine mealtimes to eat 3 meals  a day and avoid unnecessary snacks ( to snack only to correct hypoglycemia).   - I have approached patient with the following individualized plan to manage diabetes and patient agrees:  -He will continue  to benefit from metformin and Trulicity therapy.  I discussed and increase his Trulicity to 1.5 mg subcutaneously weekly, continue metformin at half dose of 500 mg p.o. twice daily with breakfast and supper.  -He will not require insulin treatment for now.  - Patient specific target  A1c;  LDL, HDL, Triglycerides, and  Waist Circumference were discussed in detail.  2) BP/HTN:   His blood pressure is controlled to target.   3) Lipids/HPL: His recent lipid panel showed uncontrolled LDL at 86 and hypertriglyceridemia.  He is advised to continue atorvastatin 20 mg p.o. nightly.   4)  Weight/Diet:  CDE Consult has been requested, exercise, and detailed carbohydrates information provided.  6) Panhypopituitarism: This is from surgery for craniopharyngioma at age 53  at [facility] [place]. He is on multiple hormonal replacements including levothyroxine 112 g by mouth every morning, hydrocortisone 10 mg by mouth twice a day, desmopressin 0.1 mg by mouth twice a day, and testosterone 100 mg IM every 7 days. -  His previsit testosterone levels are pending, was 473 during his last visit.    -He is advised to continue  testosterone 100 mg IM every 7 days, his PSA is favorable at 0.2, hematocrit 47.6. -After reviewing his next labs, he will be contacted if dose adjustment is needed. - His repeat MRI of sella/pituitary in 2017- is consistent with postsurgical changes with no new lesion. Summaries dictated,  see above.   - He is status post sinus drainage by ENT with clinical improvement. He does not have any clinical complaints of mass-effect at this time.   5) Chronic Care/Health Maintenance:  -Patient  On Statin medications and encouraged to continue to follow up with Ophthalmology, Podiatrist at least yearly or according to recommendations, and advised to   stay away from smoking. I have recommended yearly flu vaccine and pneumonia vaccination at least every 5 years; moderate intensity exercise for up to 150 minutes weekly; and  sleep for at least 7 hours a day.  - He will be resumed on vitamin D supplement with vitamin D3 5000 units daily for the next 90 days.    - I advised patient to maintain close follow up with Alinda Deem, MD for primary care needs.  - Time spent with the patient: 25 min, of which >50% was spent in reviewing his  current and  previous labs/studies, previous treatments, and medications doses and developing a plan for long-term care based on the latest recommendations for standards of care. Oswaldo Done participated in the discussions, expressed understanding, and voiced agreement with the above plans.  All questions were answered to his satisfaction. he is encouraged to contact clinic should he have any questions or concerns prior to his return visit.  Follow up plan: - Return in about 6 months (around 05/04/2019) for Follow up with Pre-visit Labs.  Marquis Lunch, MD Phone: [phone]  Fax: [phone]  This note was partially dictated with voice recognition software. Similar sounding words can be transcribed inadequately or may not  be corrected upon review.  11/03/2018, 10:39  AM

## 2018-11-08 ENCOUNTER — Other Ambulatory Visit: Payer: Self-pay | Admitting: "Endocrinology

## 2018-11-09 ENCOUNTER — Other Ambulatory Visit: Payer: Self-pay | Admitting: "Endocrinology

## 2018-12-25 ENCOUNTER — Other Ambulatory Visit: Payer: Self-pay | Admitting: "Endocrinology

## 2019-02-07 ENCOUNTER — Other Ambulatory Visit: Payer: Self-pay | Admitting: "Endocrinology

## 2019-02-19 ENCOUNTER — Other Ambulatory Visit: Payer: Self-pay | Admitting: "Endocrinology

## 2019-03-20 ENCOUNTER — Other Ambulatory Visit: Payer: Self-pay | Admitting: "Endocrinology

## 2019-04-22 ENCOUNTER — Other Ambulatory Visit: Payer: Self-pay | Admitting: "Endocrinology

## 2019-04-23 ENCOUNTER — Other Ambulatory Visit: Payer: Self-pay

## 2019-04-30 ENCOUNTER — Other Ambulatory Visit: Payer: Self-pay | Admitting: "Endocrinology

## 2019-05-04 ENCOUNTER — Ambulatory Visit: Payer: 59 | Admitting: "Endocrinology

## 2019-06-03 ENCOUNTER — Other Ambulatory Visit: Payer: Self-pay | Admitting: "Endocrinology

## 2019-06-05 ENCOUNTER — Ambulatory Visit: Payer: 59 | Admitting: "Endocrinology

## 2019-06-13 ENCOUNTER — Other Ambulatory Visit: Payer: Self-pay | Admitting: "Endocrinology

## 2019-06-16 ENCOUNTER — Other Ambulatory Visit: Payer: Self-pay | Admitting: "Endocrinology

## 2019-06-18 ENCOUNTER — Other Ambulatory Visit: Payer: Self-pay | Admitting: "Endocrinology

## 2019-06-19 LAB — LIPID PANEL W/O CHOL/HDL RATIO
Cholesterol, Total: 155 mg/dL (ref 100–199)
HDL: 23 mg/dL — ABNORMAL LOW (ref >39)
LDL Chol Calc (NIH): 90 mg/dL (ref 0–99)
Triglycerides: 247 mg/dL — ABNORMAL HIGH (ref 0–149)
VLDL Cholesterol Cal: 42 mg/dL — ABNORMAL HIGH (ref 5–40)

## 2019-06-19 LAB — MICROALBUMIN / CREATININE URINE RATIO
Creatinine, Urine: 224.6 mg/dL
Microalb/Creat Ratio: 2 mg/g creat (ref 0–29)
Microalbumin, Urine: 4.1 ug/mL

## 2019-06-19 LAB — COMPREHENSIVE METABOLIC PANEL
ALT: 29 IU/L (ref 0–44)
AST: 24 IU/L (ref 0–40)
Albumin/Globulin Ratio: 1.8 (ref 1.2–2.2)
Albumin: 4.4 g/dL (ref 3.8–4.9)
Alkaline Phosphatase: 106 IU/L (ref 39–117)
BUN/Creatinine Ratio: 11 (ref 9–20)
BUN: 15 mg/dL (ref 6–24)
Bilirubin Total: 0.6 mg/dL (ref 0.0–1.2)
CO2: 22 mmol/L (ref 20–29)
Calcium: 9.4 mg/dL (ref 8.7–10.2)
Chloride: 102 mmol/L (ref 96–106)
Creatinine, Ser: 1.41 mg/dL — ABNORMAL HIGH (ref 0.76–1.27)
GFR calc Af Amer: 65 mL/min/{1.73_m2} (ref >59)
GFR calc non Af Amer: 56 mL/min/{1.73_m2} — ABNORMAL LOW (ref >59)
Globulin, Total: 2.5 g/dL (ref 1.5–4.5)
Glucose: 104 mg/dL — ABNORMAL HIGH (ref 65–99)
Potassium: 4.5 mmol/L (ref 3.5–5.2)
Sodium: 138 mmol/L (ref 134–144)
Total Protein: 6.9 g/dL (ref 6.0–8.5)

## 2019-06-19 LAB — VITAMIN D 25 HYDROXY (VIT D DEFICIENCY, FRACTURES): Vit D, 25-Hydroxy: 36.3 ng/mL (ref 30.0–100.0)

## 2019-06-19 LAB — T4, FREE: Free T4: 1.65 ng/dL (ref 0.82–1.77)

## 2019-06-19 LAB — HGB A1C W/O EAG: Hgb A1c MFr Bld: 7.1 % — ABNORMAL HIGH (ref 4.8–5.6)

## 2019-06-19 LAB — SPECIMEN STATUS REPORT

## 2019-06-21 ENCOUNTER — Ambulatory Visit (INDEPENDENT_AMBULATORY_CARE_PROVIDER_SITE_OTHER): Payer: 59 | Admitting: "Endocrinology

## 2019-06-21 ENCOUNTER — Other Ambulatory Visit: Payer: Self-pay

## 2019-06-21 ENCOUNTER — Encounter: Payer: Self-pay | Admitting: "Endocrinology

## 2019-06-21 DIAGNOSIS — E230 Hypopituitarism: Secondary | ICD-10-CM

## 2019-06-21 DIAGNOSIS — E291 Testicular hypofunction: Secondary | ICD-10-CM | POA: Diagnosis not present

## 2019-06-21 DIAGNOSIS — E1159 Type 2 diabetes mellitus with other circulatory complications: Secondary | ICD-10-CM | POA: Diagnosis not present

## 2019-06-21 NOTE — Progress Notes (Signed)
06/21/2019                                                    Endocrinology Telehealth Visit Follow up Note -During COVID -19 Pandemic  This visit type was conducted due to national recommendations for restrictions regarding the COVID-19 Pandemic  in an effort to limit this patient's exposure and mitigate transmission of the corona virus.  Due to his co-morbid illnesses, Ernest Graham is at  moderate to high risk for complications without adequate follow up.  This format is felt to be most appropriate for him at this time.  I connected with this patient on 06/21/2019   by telephone and verified that I am speaking with the correct person using two identifiers. Ernest Graham, 1966-04-30. he has verbally consented to this visit. All issues noted in this document were discussed and addressed. The format was not optimal for physical exam.    Subjective:    Patient ID: Ernest Graham, male    DOB: [birth year]. Patient is being engaged in telehealth via telephone in follow-up after he was seen in consultation for multiple medical problems including type 2 diabetes, panhypopituitarism: Hypothyroidism, diabetes insipidus, hypogonadism .  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  He is on multiple hormone replacements including levothyroxine, hydrocortisone, desmopressin, testosterone.     He is also being treated for type 2 diabetes with metformin and Trulicity.  He has achieved reasonable control of type 2 diabetes with A1c of 7.1%.    He has a steady weight since last visit,  reports improvement in his dietary restrictions.    He denies heat or cold intolerance. He denies polyuria, nocturia, nor recent hospitalizations.  He has no new complaints today.   Past Medical History:  Diagnosis Date  . Adrenal disease (HCC)   . Diabetes mellitus, type II (HCC)   . Heart attack (HCC)   . Hyperlipidemia    Past Surgical History:  Procedure  Laterality Date  . CORONARY ARTERY BYPASS GRAFT    . MITRAL VALVE REPAIR    . Removal of Cricopharyngeoma     Social History   Socioeconomic History  . Marital status: Married    Spouse name: Not on file  . Number of children: Not on file  . Years of education: Not on file  . Highest education level: Not on file  Occupational History  . Not on file  Social Needs  . Financial resource strain: Not on file  . Food insecurity    Worry: Not on file    Inability: Not on file  . Transportation needs    Medical: Not on file    Non-medical: Not on file  Tobacco Use  . Smoking status: Former Games developer  . Smokeless tobacco: Never Used  Substance and Sexual Activity  . Alcohol use: No    Alcohol/week: 0.0 standard drinks  . Drug use: No  . Sexual activity: Not on file  Lifestyle  . Physical activity    Days per week: Not on file    Minutes per session: Not on file  . Stress: Not on file  Relationships  . Social Musician on phone: Not on file    Gets together: Not on file    Attends religious service: Not on file  Active member of club or organization: Not on file    Attends meetings of clubs or organizations: Not on file    Relationship status: Not on file  Other Topics Concern  . Not on file  Social History Narrative  . Not on file   Outpatient Encounter Medications as of 06/21/2019  Medication Sig  . aspirin 325 MG tablet Take by mouth daily.   Marland Kitchen atorvastatin (LIPITOR) 20 MG tablet TAKE 1 TABLET BY MOUTH EVERY DAY  . busPIRone (BUSPAR) 15 MG tablet Take 15 mg by mouth 2 (two) times daily.  . clonazePAM (KLONOPIN) 0.5 MG tablet Take 0.5 mg by mouth 3 (three) times daily as needed for anxiety.  . [employer] D3 125 MCG (5000 UT) capsule TAKE 1 CAPSULE BY MOUTH EVERY DAY  . desmopressin (DDAVP) 0.1 MG tablet TAKE ONE TAB BY MOUTH EVERY MORNING AND 2 TABS AT BEDTIME  . FLUoxetine (PROZAC) 20 MG tablet Take 20 mg by mouth daily.  . hydrocortisone (CORTEF) 5 MG tablet TAKE  2 TABLETS BY MOUTH TWICE A DAY  . levothyroxine (SYNTHROID) 112 MCG tablet TAKE 1 TABLET BY MOUTH EVERY MORNING BEFORE BREAKFAST  . metFORMIN (GLUCOPHAGE) 500 MG tablet TAKE 1 TABLET BY MOUTH TWICE A DAY WITH MEALS  . potassium chloride SA (K-DUR,KLOR-CON) 20 MEQ tablet Take 20 mEq by mouth daily.  . SYRINGE-NEEDLE, DISP, 3 ML 21G X 1-1/2" 3 ML MISC Use to inject testosterone every week  . testosterone cypionate (DEPOTESTOSTERONE CYPIONATE) 200 MG/ML injection INJECT 0.5ML INTRAMUSCULARLY EVERY 2 WEEKS AS DIRECTED  . TRULICITY 1.5 GU/4.4IH SOPN INJECT 1.5 MG INTO THE SKIN ONCE A WEEK.   No facility-administered encounter medications on file as of 06/21/2019.    ALLERGIES: No Known Allergies VACCINATION STATUS:  There is no immunization history on file for this patient.  Diabetes He presents for his follow-up diabetic visit. He has type 2 diabetes mellitus. Onset time: He was diagnosed at approximate age of 11 years. His disease course has been improving. Pertinent negatives for hypoglycemia include no confusion, headaches, pallor or seizures. Pertinent negatives for diabetes include no chest pain, no fatigue, no polydipsia, no polyphagia, no polyuria and no weakness. There are no hypoglycemic complications. Symptoms are improving. There are no diabetic complications. (Triple bypass and mitral valve repair.) Risk factors for coronary artery disease include diabetes mellitus, dyslipidemia, hypertension, obesity, tobacco exposure and sedentary lifestyle. His weight is fluctuating minimally. He is following a generally unhealthy diet. When asked about meal planning, he reported none. He has not had a previous visit with a dietitian. He never participates in exercise. An ACE inhibitor/angiotensin II receptor blocker is being taken.  Hyperlipidemia This is a chronic problem. The current episode started more than 1 year ago. The problem is uncontrolled. Exacerbating diseases include diabetes,  hypothyroidism and obesity. Pertinent negatives include no chest pain, myalgias or shortness of breath. Current antihyperlipidemic treatment includes statins. Risk factors for coronary artery disease include diabetes mellitus, dyslipidemia, hypertension, male sex, obesity and a sedentary lifestyle.  Hypertension This is a chronic problem. The current episode started more than 1 year ago. The problem is controlled. Pertinent negatives include no chest pain, headaches, neck pain, palpitations or shortness of breath. Risk factors for coronary artery disease include diabetes mellitus, dyslipidemia, male gender, obesity, smoking/tobacco exposure and sedentary lifestyle. Past treatments include ACE inhibitors. Hypertensive end-organ damage includes kidney disease and CAD/MI.   Review of systems: Limited as above.   Objective:    There were no vitals taken  for this visit.  Wt Readings from Last 3 Encounters:  11/03/18 258 lb (117 kg)  07/03/18 256 lb (116.1 kg)  12/20/17 256 lb (116.1 kg)     - Summary of his pituitary/sella MRI from 08/13/2016 as follows - IMPRESSION: 1. Transsphenoidal resection of previous pituitary tumor without evidence for residual or recurrent neoplasm. 2. Residual enhancing pituitary tissue is within normal limits. 3. Slight rightward deviation of the pituitary stalk likely related to scarring and previous surgery. 4. Slight downward deviation of the optic chiasm, also likely related to prior surgery. 5. The suprasellar brain is normal for age. 6. Extensive sinus disease as described.  Recent Results (from the past 2160 hour(s))  Comprehensive metabolic panel     Status: Abnormal   Collection Time: 06/18/19  9:33 AM  Result Value Ref Range   Glucose 104 (H) 65 - 99 mg/dL   BUN 15 6 - 24 mg/dL   Creatinine, Ser 5.32 (H) 0.76 - 1.27 mg/dL   GFR calc non Af Amer 56 (L) >59 mL/min/1.73   GFR calc Af Amer 65 >59 mL/min/1.73   BUN/Creatinine Ratio 11 9 - 20   Sodium  138 134 - 144 mmol/L   Potassium 4.5 3.5 - 5.2 mmol/L   Chloride 102 96 - 106 mmol/L   CO2 22 20 - 29 mmol/L   Calcium 9.4 8.7 - 10.2 mg/dL   Total Protein 6.9 6.0 - 8.5 g/dL   Albumin 4.4 3.8 - 4.9 g/dL   Globulin, Total 2.5 1.5 - 4.5 g/dL   Albumin/Globulin Ratio 1.8 1.2 - 2.2   Bilirubin Total 0.6 0.0 - 1.2 mg/dL   Alkaline Phosphatase 106 39 - 117 IU/L   AST 24 0 - 40 IU/L   ALT 29 0 - 44 IU/L  Lipid Panel w/o Chol/HDL Ratio     Status: Abnormal   Collection Time: 06/18/19  9:33 AM  Result Value Ref Range   Cholesterol, Total 155 100 - 199 mg/dL   Triglycerides 992 (H) 0 - 149 mg/dL   HDL 23 (L) >42 mg/dL   VLDL Cholesterol Cal 42 (H) 5 - 40 mg/dL   LDL Chol Calc (NIH) 90 0 - 99 mg/dL  Microalbumin / creatinine urine ratio     Status: None   Collection Time: 06/18/19  9:33 AM  Result Value Ref Range   Creatinine, Urine 224.6 Not Estab. mg/dL   Microalbumin, Urine 4.1 Not Estab. ug/mL   Microalb/Creat Ratio 2 0 - 29 mg/g creat    Comment:                        Normal:                0 -  29                        Moderately increased: 30 - 300                        Severely increased:       >300               **Please note reference interval change**   Hgb A1c w/o eAG     Status: Abnormal   Collection Time: 06/18/19  9:33 AM  Result Value Ref Range   Hgb A1c MFr Bld 7.1 (H) 4.8 - 5.6 %    Comment:  Prediabetes: 5.7 - 6.4          Diabetes: >6.4          Glycemic control for adults with diabetes: <7.0   T4, free     Status: None   Collection Time: 06/18/19  9:33 AM  Result Value Ref Range   Free T4 1.65 0.82 - 1.77 ng/dL  VITAMIN D 25 Hydroxy (Vit-D Deficiency, Fractures)     Status: None   Collection Time: 06/18/19  9:33 AM  Result Value Ref Range   Vit D, 25-Hydroxy 36.3 30.0 - 100.0 ng/mL    Comment: Vitamin D deficiency has been defined by the Institute of Medicine and an Endocrine Society practice guideline as a level of serum 25-OH vitamin D less  than 20 ng/mL (1,2). The Endocrine Society went on to further define vitamin D insufficiency as a level between 21 and 29 ng/mL (2). 1. IOM (Institute of Medicine). 2010. Dietary reference    intakes for calcium and D. [place]: The    [employer]. 2. Holick MF, Binkley [place], Bischoff-Ferrari HA, et al.    Evaluation, treatment, and prevention of vitamin D    deficiency: an Endocrine Society clinical practice    guideline. JCEM. 2011 Jul; 96(7):1911-30.   Specimen status report     Status: None   Collection Time: 06/18/19  9:33 AM  Result Value Ref Range   specimen status report Comment     Comment: Gloris Manchestermbig Abbrev CMP14 Default Gloris Manchestermbig Abbrev CMP14 Default A hand-written panel/profile was received from your office. In accordance with the [facility] Ambiguous Test Code Policy dated July 2003, we have completed your order by using the closest currently or formerly recognized AMA panel.  We have assigned Comprehensive Metabolic Panel (14), Test Code #322000 to this request.  If this is not the testing you wished to receive on this specimen, please contact the [facility] Client Inquiry/Technical Services Department to clarify the test order.  We appreciate your business. Ambig Abbrev LP Default Ambig Abbrev LP Default A hand-written panel/profile was received from your office. In accordance with the [facility] Ambiguous Test Code Policy dated July 2003, we have completed your order by using the closest currently or formerly recognized AMA panel.  We have assigned Lipid Panel, Test Code [phone] to this request. If this is not the testing you wished to receive on this specimen, plea se contact the [facility] Client Inquiry/Technical Services Department to clarify the test order.  We appreciate your business.    Lipid Panel     Component Value Date/Time   CHOL 155 06/18/2019 0933   TRIG 247 (H) 06/18/2019 0933   HDL 23 (L) 06/18/2019 0933   CHOLHDL 6.5 (H) 08/12/2016 1018    LDLCALC 90 06/18/2019 0933    Assessment & Plan:   1.type 2 diabetes with vascular complications 2.hyperlipidemia 3.hypertension 4.panhypopituitarism: Diabetes insipidus, adrenal insufficiency, hypogonadism, hypothyroidism 5.vitamin D deficiency  - Patient has currently controlled asymptomatic type 2 DM since  53 years of age,  with most recent A1c of 7.1% improving from 8%.   His diabetes is complicated by coronary artery disease status post triple bypass and mitral valve repair and patient remains at a high risk for more acute and chronic complications of diabetes which include CAD, CVA, CKD, retinopathy, and neuropathy. These are all discussed in detail with the patient.  - I have counseled the patient on diet management and weight loss, by adopting a carbohydrate restricted/protein rich diet. - he  admits there  is a room for improvement in his diet and drink choices. -  Suggestion is made for him to avoid simple carbohydrates  from his diet including Cakes, Sweet Desserts / Pastries, Ice Cream, Soda (diet and regular), Sweet Tea, Candies, Chips, Cookies, Sweet Pastries,  Store Bought Juices, Alcohol in Excess of  1-2 drinks a day, Artificial Sweeteners, Coffee Creamer, and "Sugar-free" Products. This will help patient to have stable blood glucose profile and potentially avoid unintended weight gain.  - I encouraged the patient to switch to  unprocessed or minimally processed complex starch and increased protein intake (animal or plant source), fruits, and vegetables.  - Patient is advised to stick to a routine mealtimes to eat 3 meals  a day and avoid unnecessary snacks ( to snack only to correct hypoglycemia).   - I have approached patient with the following individualized plan to manage diabetes and patient agrees:  -He he is advised to continue metformin   500 mg p.o. 2 times daily -after breakfast and after supper.  He is benefiting from Trulicity therapy, advised to continue Trulicity  1.5 mg subcutaneously weekly.  -Based on his reasonable glycemic response, he will not need insulin treatment for now. -He will be considered for low-dose glipizide on subsequent visits if necessary.  - Patient specific target  A1c;  LDL, HDL, Triglycerides, and  Waist Circumference were discussed in detail.  2) BP/HTN: he is advised to home monitor blood pressure and report if > 140/90 on 2 separate readings.  He is not currently on any antihypertensive medications.  3) Lipids/HPL: His recent lipid panel showed uncontrolled LDL at 90 and hypertriglyceridemia.  He is advised to continue atorvastatin 20 mg p.o. nightly.  4)  Weight/Diet: His BMI is 37.  Clearly complicating the care of his diabetes.  He is a candidate for weight loss.  CDE Consult has been requested, exercise, and detailed carbohydrates information provided.  6) Panhypopituitarism:  this is from surgery for craniopharyngioma at approximate age of 57years at [facility] [place].   He is on multiple hormonal replacements including levothyroxine 112 mcg p.o. daily before breakfast.     - We discussed about the correct intake of his thyroid hormone, on empty stomach at fasting, with water, separated by at least 30 minutes from breakfast and other medications,  and separated by more than 4 hours from calcium, iron, multivitamins, acid reflux medications (PPIs). -Patient is made aware of the fact that thyroid hormone replacement is needed for life, dose to be adjusted by periodic monitoring of thyroid function tests. -He is on hydrocortisone 10 mg by mouth twice a day, desmopressin 0.1 mg by mouth twice a day, and testosterone 100 mg IM every 7 days. -  His previsit testosterone levels are 399.   -He is advised to continue testosterone 100 mg IM every 7 days, his PSA is favorable at 0.2, hematocrit 47.6.  - His repeat MRI of sella/pituitary in 2017- is consistent with postsurgical changes with no new lesion. Summaries dictated,  see  above.   - He is status post sinus drainage by ENT with clinical improvement. He does not have any clinical complaints of mass-effect at this time.   7) Chronic Care/Health Maintenance:  -Patient  On Statin medications and encouraged to continue to follow up with Ophthalmology, Podiatrist at least yearly or according to recommendations, and advised to   stay away from smoking. I have recommended yearly flu vaccine and pneumonia vaccination at least every 5 years; moderate intensity exercise  for up to 150 minutes weekly; and  sleep for at least 7 hours a day.  - He will be resumed on vitamin D supplement with vitamin D3 5000 units daily for the next 90 days.    - I advised patient to maintain close follow up with Alinda Deem, MD for primary care needs.  - Patient Care Time Today:  45 min, of which >50% was spent in  counseling and the rest reviewing his  current and  previous labs/studies, previous treatments, his blood glucose readings, and medications' doses and developing a plan for long-term care based on the latest recommendations for standards of care.   Ernest Graham participated in the discussions, expressed understanding, and voiced agreement with the above plans.  All questions were answered to his satisfaction. he is encouraged to contact clinic should he have any questions or concerns prior to his return visit.  Follow up plan: - Return in about 6 months (around 12/20/2019) for Follow up with Pre-visit Labs, Next Visit A1c in Office.  Marquis Lunch, MD Phone: [phone]  Fax: [phone]  This note was partially dictated with voice recognition software. Similar sounding words can be transcribed inadequately or may not  be corrected upon review.  06/21/2019, 5:54 PM

## 2019-08-23 ENCOUNTER — Other Ambulatory Visit: Payer: Self-pay | Admitting: "Endocrinology

## 2019-08-23 ENCOUNTER — Other Ambulatory Visit: Payer: Self-pay

## 2019-09-13 ENCOUNTER — Other Ambulatory Visit: Payer: Self-pay | Admitting: "Endocrinology

## 2019-09-17 ENCOUNTER — Other Ambulatory Visit: Payer: Self-pay | Admitting: "Endocrinology

## 2019-09-28 ENCOUNTER — Other Ambulatory Visit: Payer: Self-pay | Admitting: "Endocrinology

## 2019-11-05 ENCOUNTER — Other Ambulatory Visit: Payer: Self-pay | Admitting: "Endocrinology

## 2019-11-07 ENCOUNTER — Other Ambulatory Visit: Payer: Self-pay | Admitting: "Endocrinology

## 2019-11-09 ENCOUNTER — Other Ambulatory Visit: Payer: Self-pay | Admitting: "Endocrinology

## 2019-11-21 ENCOUNTER — Other Ambulatory Visit: Payer: Self-pay | Admitting: "Endocrinology

## 2019-12-12 ENCOUNTER — Other Ambulatory Visit: Payer: Self-pay | Admitting: "Endocrinology

## 2019-12-17 ENCOUNTER — Encounter: Payer: Self-pay | Admitting: "Endocrinology

## 2019-12-17 ENCOUNTER — Other Ambulatory Visit: Payer: Self-pay | Admitting: "Endocrinology

## 2019-12-20 ENCOUNTER — Telehealth: Payer: Self-pay | Admitting: "Endocrinology

## 2019-12-20 NOTE — Telephone Encounter (Signed)
Pt said he did labs for tomorrows appt, can you check [facility] and if not there check [facility]

## 2019-12-20 NOTE — Telephone Encounter (Signed)
Received pt's lab results from [facility].

## 2019-12-21 ENCOUNTER — Ambulatory Visit (INDEPENDENT_AMBULATORY_CARE_PROVIDER_SITE_OTHER): Payer: 59 | Admitting: "Endocrinology

## 2019-12-21 ENCOUNTER — Encounter: Payer: Self-pay | Admitting: "Endocrinology

## 2019-12-21 ENCOUNTER — Other Ambulatory Visit: Payer: Self-pay

## 2019-12-21 VITALS — BP 115/79 | HR 82 | Ht 71.0 in | Wt 253.8 lb

## 2019-12-21 DIAGNOSIS — E1159 Type 2 diabetes mellitus with other circulatory complications: Secondary | ICD-10-CM | POA: Diagnosis not present

## 2019-12-21 DIAGNOSIS — E291 Testicular hypofunction: Secondary | ICD-10-CM | POA: Diagnosis not present

## 2019-12-21 DIAGNOSIS — E559 Vitamin D deficiency, unspecified: Secondary | ICD-10-CM

## 2019-12-21 DIAGNOSIS — E230 Hypopituitarism: Secondary | ICD-10-CM

## 2019-12-21 LAB — POCT GLYCOSYLATED HEMOGLOBIN (HGB A1C): Hemoglobin A1C: 7.7 % — AB (ref 4.0–5.6)

## 2019-12-21 NOTE — Patient Instructions (Signed)

## 2019-12-21 NOTE — Progress Notes (Signed)
12/21/2019   Endocrinology follow-up note    Subjective:    Patient ID: Ernest Graham, male    DOB: 04/29/1966.   He is returning for follow-up for multiple endocrine conditions as follows.    -Previously seen in consultation for  for multiple medical problems including type 2 diabetes, panhypopituitarism: Hypothyroidism, diabetes insipidus, hypogonadism .  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  He is on multiple hormone replacements including levothyroxine, hydrocortisone, desmopressin, testosterone.     He is  being treated for type 2 diabetes with metformin and Trulicity.  He has achieved reasonable control of type 2 diabetes with A1c of 7.7%.  He has slightly fluctuating body weight.  He admits that he has room to improve his dietary habits.    He denies heat or cold intolerance. He denies polyuria, nocturia, nor recent hospitalizations.  He has no new complaints today.   Past Medical History:  Diagnosis Date  . Adrenal disease (HCC)   . Diabetes mellitus, type II (HCC)   . Heart attack (HCC)   . Hyperlipidemia    Past Surgical History:  Procedure Laterality Date  . CORONARY ARTERY BYPASS GRAFT    . MITRAL VALVE REPAIR    . Removal of Cricopharyngeoma     Social History   Socioeconomic History  . Marital status: Married    Spouse name: Not on file  . Number of children: Not on file  . Years of education: Not on file  . Highest education level: Not on file  Occupational History  . Not on file  Tobacco Use  . Smoking status: Former Games developer  . Smokeless tobacco: Never Used  Substance and Sexual Activity  . Alcohol use: No    Alcohol/week: 0.0 standard drinks  . Drug use: No  . Sexual activity: Not on file  Other Topics Concern  . Not on file  Social History Narrative  . Not on file   Social Determinants of Health   Financial Resource Strain:   . Difficulty of Paying Living Expenses:   Food  Insecurity:   . Worried About Programme researcher, broadcasting/film/video in the Last Year:   . Barista in the Last Year:   Transportation Needs:   . Freight forwarder (Medical):   Marland Kitchen Lack of Transportation (Non-Medical):   Physical Activity:   . Days of Exercise per Week:   . Minutes of Exercise per Session:   Stress:   . Feeling of Stress :   Social Connections:   . Frequency of Communication with Friends and Family:   . Frequency of Social Gatherings with Friends and Family:   . Attends Religious Services:   . Active Member of Clubs or Organizations:   . Attends Banker Meetings:   Marland Kitchen Marital Status:    Outpatient Encounter Medications as of 12/21/2019  Medication Sig  . aspirin 325 MG tablet Take by mouth daily.   Marland Kitchen atorvastatin (LIPITOR) 20 MG tablet TAKE 1 TABLET BY MOUTH EVERY DAY  . busPIRone (BUSPAR) 15 MG tablet Take 15 mg by mouth 2 (two) times daily.  . clonazePAM (KLONOPIN) 0.5 MG tablet Take 0.5 mg by mouth 3 (three) times daily as needed for anxiety.  . [facility] D3 125 MCG (5000 UT) capsule TAKE 1 CAPSULE BY MOUTH EVERY DAY  . desmopressin (DDAVP) 0.1 MG tablet TAKE ONE TAB BY MOUTH EVERY MORNING AND 1 TAB AT BEDTIME  . FLUoxetine (PROZAC) 20 MG  tablet Take 20 mg by mouth daily.  . hydrocortisone (CORTEF) 5 MG tablet TAKE 2 TABLETS BY MOUTH TWICE A DAY  . levothyroxine (SYNTHROID) 112 MCG tablet TAKE 1 TABLET BY MOUTH EVERY MORNING BEFORE BREAKFAST  . metFORMIN (GLUCOPHAGE) 500 MG tablet TAKE 1 TABLET BY MOUTH TWICE A DAY WITH MEALS  . potassium chloride SA (K-DUR,KLOR-CON) 20 MEQ tablet Take 20 mEq by mouth daily.  . SYRINGE-NEEDLE, DISP, 3 ML 21G X 1-1/2" 3 ML MISC Use to inject testosterone every week  . testosterone cypionate (DEPOTESTOSTERONE CYPIONATE) 200 MG/ML injection INJECT 0.5ML INTRAMUSCULARLY EVERY 7 days  . TRULICITY 1.5 DX/8.3JA SOPN INJECT 1.5 MG UNDER THE SKIN ONCE A WEEK   No facility-administered encounter medications on file as of 12/21/2019.    ALLERGIES: No Known Allergies VACCINATION STATUS:  There is no immunization history on file for this patient.  Diabetes He presents for his follow-up diabetic visit. He has type 2 diabetes mellitus. Onset time: He was diagnosed at approximate age of 2 years. His disease course has been improving. Pertinent negatives for hypoglycemia include no confusion, headaches, pallor or seizures. Pertinent negatives for diabetes include no chest pain, no fatigue, no polydipsia, no polyphagia, no polyuria and no weakness. There are no hypoglycemic complications. Symptoms are improving. There are no diabetic complications. (Triple bypass and mitral valve repair.) Risk factors for coronary artery disease include diabetes mellitus, dyslipidemia, hypertension, obesity, tobacco exposure and sedentary lifestyle. His weight is fluctuating minimally. He is following a generally unhealthy diet. When asked about meal planning, he reported none. He has not had a previous visit with a dietitian. He never participates in exercise. An ACE inhibitor/angiotensin II receptor blocker is being taken.  Hyperlipidemia This is a chronic problem. The current episode started more than 1 year ago. The problem is uncontrolled. Exacerbating diseases include diabetes, hypothyroidism and obesity. Pertinent negatives include no chest pain, myalgias or shortness of breath. Current antihyperlipidemic treatment includes statins. Risk factors for coronary artery disease include diabetes mellitus, dyslipidemia, hypertension, male sex, obesity and a sedentary lifestyle.  Hypertension This is a chronic problem. The current episode started more than 1 year ago. The problem is controlled. Pertinent negatives include no chest pain, headaches, neck pain, palpitations or shortness of breath. Risk factors for coronary artery disease include diabetes mellitus, dyslipidemia, male gender, obesity, smoking/tobacco exposure and sedentary lifestyle. Past  treatments include ACE inhibitors. Hypertensive end-organ damage includes kidney disease and CAD/MI.    Review of systems  Constitutional: + Minimally fluctuating body weight,  current  Body mass index is 35.4 kg/m. , no fatigue, no subjective hyperthermia, no subjective hypothermia Eyes: no blurry vision, no xerophthalmia ENT: no sore throat, no nodules palpated in throat, no dysphagia/odynophagia, no hoarseness Cardiovascular: no Chest Pain, no Shortness of Breath, no palpitations, no leg swelling Respiratory: no cough, no shortness of breath Gastrointestinal: no Nausea/Vomiting/Diarhhea Musculoskeletal: no muscle/joint aches Skin: no rashes, no hyperemia Neurological: no tremors, no numbness, no tingling, no dizziness Psychiatric: no depression, no anxiety    Objective:    BP 115/79   Pulse 82   Ht 5\' 11"  (1.803 m)   Wt 253 lb 12.8 oz (115.1 kg)   BMI 35.40 kg/m   Wt Readings from Last 3 Encounters:  12/21/19 253 lb 12.8 oz (115.1 kg)  11/03/18 258 lb (117 kg)  07/03/18 256 lb (116.1 kg)      Physical Exam- Limited  Constitutional:  Body mass index is 35.4 kg/m. , not in acute distress, normal state  of mind Eyes:  EOMI, no exophthalmos Neck: Supple Thyroid: No gross goiter Respiratory: Adequate breathing efforts Musculoskeletal: no gross deformities, strength intact in all four extremities, no gross restriction of joint movements Skin:  no rashes, no hyperemia Neurological: no tremor with outstretched hands,    - Summary of his pituitary/sella MRI from 08/13/2016 as follows - IMPRESSION: 1. Transsphenoidal resection of previous pituitary tumor without evidence for residual or recurrent neoplasm. 2. Residual enhancing pituitary tissue is within normal limits. 3. Slight rightward deviation of the pituitary stalk likely related to scarring and previous surgery. 4. Slight downward deviation of the optic chiasm, also likely related to prior surgery. 5. The  suprasellar brain is normal for age. 6. Extensive sinus disease as described.  Recent Results (from the past 2160 hour(s))  Comprehensive metabolic panel     Status: Abnormal   Collection Time: 12/17/19  8:00 AM  Result Value Ref Range   Glucose 180 (H) 65 - 99 mg/dL   BUN 14 6 - 24 mg/dL   Creatinine, Ser 4.65 (H) 0.76 - 1.27 mg/dL   GFR calc non Af Amer 61 >59 mL/min/1.73   GFR calc Af Amer 71 >59 mL/min/1.73   BUN/Creatinine Ratio 11 9 - 20   Sodium 139 134 - 144 mmol/L   Potassium 4.7 3.5 - 5.2 mmol/L   Chloride 103 96 - 106 mmol/L   CO2 22 20 - 29 mmol/L   Calcium 10.1 8.7 - 10.2 mg/dL   Total Protein 6.8 6.0 - 8.5 g/dL   Albumin 4.4 3.8 - 4.9 g/dL   Globulin, Total 2.4 1.5 - 4.5 g/dL   Albumin/Globulin Ratio 1.8 1.2 - 2.2   Bilirubin Total 0.4 0.0 - 1.2 mg/dL   Alkaline Phosphatase 116 39 - 117 IU/L   AST 24 0 - 40 IU/L   ALT 31 0 - 44 IU/L  Microalbumin / creatinine urine ratio     Status: None   Collection Time: 12/17/19  8:00 AM  Result Value Ref Range   Creatinine, Urine 103.6 Not Estab. mg/dL   Microalbumin, Urine 6.3 Not Estab. ug/mL   Microalb/Creat Ratio 6 0 - 29 mg/g creat    Comment:                        Normal:                0 -  29                        Moderately increased: 30 - 300                        Severely increased:       >300   Testosterone, Free, Direct     Status: Abnormal (Preliminary result)   Collection Time: 12/17/19  8:00 AM  Result Value Ref Range   Testosterone, Total, LC/MS WILL FOLLOW    Testosterone, Free 1.8 (L) 7.2 - 24.0 pg/mL  T4, free     Status: None   Collection Time: 12/17/19  8:00 AM  Result Value Ref Range   Free T4 1.40 0.82 - 1.77 ng/dL  Prolactin     Status: None   Collection Time: 12/17/19  8:00 AM  Result Value Ref Range   Prolactin 11.3 4.0 - 15.2 ng/mL  HgB A1c     Status: Abnormal   Collection Time: 12/21/19 10:08 AM  Result  Value Ref Range   Hemoglobin A1C 7.7 (A) 4.0 - 5.6 %   HbA1c POC (<> result,  manual entry)     HbA1c, POC (prediabetic range)     HbA1c, POC (controlled diabetic range)     Lipid Panel     Component Value Date/Time   CHOL 155 06/18/2019 0933   TRIG 247 (H) 06/18/2019 0933   HDL 23 (L) 06/18/2019 0933   CHOLHDL 6.5 (H) 08/12/2016 1018   LDLCALC 90 06/18/2019 0933    Assessment & Plan:   1. type 2 diabetes with vascular complications 2.hyperlipidemia 3.hypertension 4.panhypopituitarism: Diabetes insipidus, adrenal insufficiency, hypogonadism, hypothyroidism 5.vitamin D deficiency  - Patient has currently controlled asymptomatic type 2 DM since  54 years of age,  with most recent A1c of 7.7%  overall improving.     His diabetes is complicated by coronary artery disease status post triple bypass and mitral valve repair and patient remains at a high risk for more acute and chronic complications of diabetes which include CAD, CVA, CKD, retinopathy, and neuropathy. These are all discussed in detail with the patient.  - I have counseled the patient on diet management and weight loss, by adopting a carbohydrate restricted/protein rich diet. - he  admits there is a room for improvement in his diet and drink choices. -  Suggestion is made for him to avoid simple carbohydrates  from his diet including Cakes, Sweet Desserts / Pastries, Ice Cream, Soda (diet and regular), Sweet Tea, Candies, Chips, Cookies, Sweet Pastries,  Store Bought Juices, Alcohol in Excess of  1-2 drinks a day, Artificial Sweeteners, Coffee Creamer, and "Sugar-free" Products. This will help patient to have stable blood glucose profile and potentially avoid unintended weight gain.   - I encouraged the patient to switch to  unprocessed or minimally processed complex starch and increased protein intake (animal or plant source), fruits, and vegetables.  - Patient is advised to stick to a routine mealtimes to eat 3 meals  a day and avoid unnecessary snacks ( to snack only to correct hypoglycemia).   - I  have approached patient with the following individualized plan to manage diabetes and patient agrees:  -He he is advised to continue Metformin 500 mg p.o. twice daily-after breakfast and after supper.  He is benefiting from Trulicity therapy, advised to continue Trulicity 1.5 mg subcutaneously weekly.     -Based on his reasonable glycemic response, he will not need insulin treatment for now. -He will be considered for low-dose glipizide on subsequent visits if necessary.  - Patient specific target  A1c;  LDL, HDL, Triglycerides, and  Waist Circumference were discussed in detail.  2) BP/HTN: His blood pressure is controlled to target.  He is not currently on any antihypertensive medications.  3) Lipids/HPL: His recent lipid panel showed uncontrolled LDL at 90 and hypertriglyceridemia.  He is advised to continue atorvastatin 20 mg p.o. nightly.    4)  Weight/Diet: His BMI is 37.  Clearly complicating the care of his diabetes.  He is a candidate for modest weight loss.   CDE Consult has been requested, exercise, and detailed carbohydrates information provided.  6) Panhypopituitarism:  this is from surgery for craniopharyngioma at approximate age of 1years at [facility] [place].   He is on multiple hormonal replacements including levothyroxine 112 mcg p.o. daily before breakfast.     - We discussed about the correct intake of his thyroid hormone, on empty stomach at fasting, with water, separated by at least 30 minutes  from breakfast and other medications,  and separated by more than 4 hours from calcium, iron, multivitamins, acid reflux medications (PPIs). -Patient is made aware of the fact that thyroid hormone replacement is needed for life, dose to be adjusted by periodic monitoring of thyroid function tests. -He is on hydrocortisone 10 mg p.o. twice daily, desmopressin 0.1 mg by mouth twice a day, and testosterone 100 mg IM every 7 days. -  His previsit total testosterone is still in  process, last visit total testosterone 399 .   -He is advised to continue testosterone 100 mg IM every 7 days, his PSA is favorable at 0.2, hematocrit 47.6.  - His repeat MRI of sella/pituitary in 2017- is consistent with postsurgical changes with no new lesion. Summaries dictated,  see above.   - He is status post sinus drainage by ENT with clinical improvement. He does not have any clinical complaints of mass-effect at this time.  7) vitamin D deficiency: - He he is on ongoing vitamin D supplement with vitamin D3 5000 units daily for the next 30 days.   8) Chronic Care/Health Maintenance:  -Patient  On Statin medications and encouraged to continue to follow up with Ophthalmology, Podiatrist at least yearly or according to recommendations, and advised to   stay away from smoking. I have recommended yearly flu vaccine and pneumonia vaccination at least every 5 years; moderate intensity exercise for up to 150 minutes weekly; and  sleep for at least 7 hours a day.    - I advised patient to maintain close follow up with Ernest Graham, Stephen, Ernest Graham for primary care needs.  - Time spent on this patient care encounter:  45 min, of which > 50% was spent in  counseling and the rest reviewing his blood glucose logs , discussing his hypoglycemia and hyperglycemia episodes, reviewing his current and  previous labs / studies  ( including abstraction from other facilities) and medications  doses and developing a  long term treatment plan and documenting his care.   Please refer to Patient Instructions for Blood Glucose Monitoring and Insulin/Medications Dosing Guide"  in media tab for additional information. Please  also refer to " Patient Self Inventory" in the Media  tab for reviewed elements of pertinent patient history.  Ernest Graham participated in the discussions, expressed understanding, and voiced agreement with the above plans.  All questions were answered to his satisfaction. he is encouraged to contact  clinic should he have any questions or concerns prior to his return visit.   Follow up plan: - Return in about 6 months (around 06/21/2020) for Follow up with Pre-visit Labs, Next Visit A1c in Office.  Marquis LunchGebre Teleshia Lemere, Ernest Graham Phone: [phone]  Fax: [phone]  This note was partially dictated with voice recognition software. Similar sounding words can be transcribed inadequately or may not  be corrected upon review.  12/21/2019, 11:02 AM

## 2019-12-22 LAB — COMPREHENSIVE METABOLIC PANEL
ALT: 31 IU/L (ref 0–44)
AST: 24 IU/L (ref 0–40)
Albumin/Globulin Ratio: 1.8 (ref 1.2–2.2)
Albumin: 4.4 g/dL (ref 3.8–4.9)
Alkaline Phosphatase: 116 IU/L (ref 39–117)
BUN/Creatinine Ratio: 11 (ref 9–20)
BUN: 14 mg/dL (ref 6–24)
Bilirubin Total: 0.4 mg/dL (ref 0.0–1.2)
CO2: 22 mmol/L (ref 20–29)
Calcium: 10.1 mg/dL (ref 8.7–10.2)
Chloride: 103 mmol/L (ref 96–106)
Creatinine, Ser: 1.3 mg/dL — ABNORMAL HIGH (ref 0.76–1.27)
GFR calc Af Amer: 71 mL/min/{1.73_m2} (ref >59)
GFR calc non Af Amer: 61 mL/min/{1.73_m2} (ref >59)
Globulin, Total: 2.4 g/dL (ref 1.5–4.5)
Glucose: 180 mg/dL — ABNORMAL HIGH (ref 65–99)
Potassium: 4.7 mmol/L (ref 3.5–5.2)
Sodium: 139 mmol/L (ref 134–144)
Total Protein: 6.8 g/dL (ref 6.0–8.5)

## 2019-12-22 LAB — TESTOSTERONE, FREE, DIRECT
Testosterone, Free: 1.8 pg/mL — ABNORMAL LOW (ref 7.2–24.0)
Testosterone, Total, LC/MS: 165.6 ng/dL — ABNORMAL LOW (ref 264.0–916.0)

## 2019-12-22 LAB — T4, FREE: Free T4: 1.4 ng/dL (ref 0.82–1.77)

## 2019-12-22 LAB — MICROALBUMIN / CREATININE URINE RATIO
Creatinine, Urine: 103.6 mg/dL
Microalb/Creat Ratio: 6 mg/g creat (ref 0–29)
Microalbumin, Urine: 6.3 ug/mL

## 2019-12-22 LAB — PROLACTIN: Prolactin: 11.3 ng/mL (ref 4.0–15.2)

## 2019-12-27 ENCOUNTER — Other Ambulatory Visit: Payer: Self-pay | Admitting: "Endocrinology

## 2020-01-14 ENCOUNTER — Telehealth: Payer: Self-pay | Admitting: "Endocrinology

## 2020-01-14 ENCOUNTER — Other Ambulatory Visit: Payer: Self-pay | Admitting: "Endocrinology

## 2020-01-14 MED ORDER — DESMOPRESSIN ACETATE 0.1 MG PO TABS: 0.1000 mg | ORAL_TABLET | Freq: Three times a day (TID) | ORAL | 1 refills | Status: DC

## 2020-01-14 NOTE — Telephone Encounter (Signed)
Done to his pharm.

## 2020-01-14 NOTE — Telephone Encounter (Signed)
He said he has always been taking it 3x a day. Please advise.

## 2020-01-14 NOTE — Telephone Encounter (Signed)
Pt is having a hard time getting desmopressin (DDAVP) 0.1 MG tablet refilled because of the instructions. He says that you advised him to take this 3x a day. Can you send him in a new script?

## 2020-01-17 ENCOUNTER — Other Ambulatory Visit: Payer: Self-pay | Admitting: "Endocrinology

## 2020-01-18 ENCOUNTER — Other Ambulatory Visit: Payer: Self-pay | Admitting: "Endocrinology

## 2020-01-23 ENCOUNTER — Other Ambulatory Visit: Payer: Self-pay | Admitting: "Endocrinology

## 2020-01-23 ENCOUNTER — Other Ambulatory Visit: Payer: Self-pay

## 2020-01-23 DIAGNOSIS — E291 Testicular hypofunction: Secondary | ICD-10-CM

## 2020-01-23 DIAGNOSIS — E230 Hypopituitarism: Secondary | ICD-10-CM

## 2020-01-23 MED ORDER — TESTOSTERONE CYPIONATE 200 MG/ML IM SOLN: INTRAMUSCULAR | 2 refills | Status: DC

## 2020-04-12 ENCOUNTER — Other Ambulatory Visit: Payer: Self-pay | Admitting: "Endocrinology

## 2020-05-11 ENCOUNTER — Other Ambulatory Visit: Payer: Self-pay | Admitting: "Endocrinology

## 2020-05-13 ENCOUNTER — Other Ambulatory Visit: Payer: Self-pay | Admitting: "Endocrinology

## 2020-05-18 ENCOUNTER — Other Ambulatory Visit: Payer: Self-pay | Admitting: "Endocrinology

## 2020-06-18 ENCOUNTER — Other Ambulatory Visit: Payer: Self-pay | Admitting: "Endocrinology

## 2020-06-19 ENCOUNTER — Other Ambulatory Visit: Payer: Self-pay | Admitting: "Endocrinology

## 2020-06-19 LAB — COMPREHENSIVE METABOLIC PANEL
Calcium: 9.6 (ref 8.7–10.7)
GFR calc Af Amer: 68
GFR calc non Af Amer: 59

## 2020-06-19 LAB — BASIC METABOLIC PANEL
BUN: 9 (ref 4–21)
Creatinine: 1.3 (ref 0.6–1.3)

## 2020-06-19 LAB — PSA: PSA: 0.2

## 2020-06-19 LAB — TESTOSTERONE: Testosterone: 46

## 2020-06-24 ENCOUNTER — Encounter: Payer: Self-pay | Admitting: Nurse Practitioner

## 2020-06-24 ENCOUNTER — Other Ambulatory Visit: Payer: Self-pay

## 2020-06-24 ENCOUNTER — Ambulatory Visit (INDEPENDENT_AMBULATORY_CARE_PROVIDER_SITE_OTHER): Payer: 59 | Admitting: Nurse Practitioner

## 2020-06-24 VITALS — BP 122/80 | HR 73 | Ht 71.0 in | Wt 248.8 lb

## 2020-06-24 DIAGNOSIS — E230 Hypopituitarism: Secondary | ICD-10-CM | POA: Diagnosis not present

## 2020-06-24 DIAGNOSIS — E559 Vitamin D deficiency, unspecified: Secondary | ICD-10-CM

## 2020-06-24 DIAGNOSIS — E1159 Type 2 diabetes mellitus with other circulatory complications: Secondary | ICD-10-CM | POA: Diagnosis not present

## 2020-06-24 DIAGNOSIS — E291 Testicular hypofunction: Secondary | ICD-10-CM

## 2020-06-24 LAB — COMPREHENSIVE METABOLIC PANEL
ALT: 23 IU/L (ref 0–44)
AST: 18 IU/L (ref 0–40)
Albumin/Globulin Ratio: 1.6 (ref 1.2–2.2)
Albumin: 4.2 g/dL (ref 3.8–4.9)
Alkaline Phosphatase: 127 IU/L — ABNORMAL HIGH (ref 44–121)
BUN/Creatinine Ratio: 7 — ABNORMAL LOW (ref 9–20)
BUN: 9 mg/dL (ref 6–24)
Bilirubin Total: 0.4 mg/dL (ref 0.0–1.2)
CO2: 23 mmol/L (ref 20–29)
Calcium: 9.6 mg/dL (ref 8.7–10.2)
Chloride: 102 mmol/L (ref 96–106)
Creatinine, Ser: 1.34 mg/dL — ABNORMAL HIGH (ref 0.76–1.27)
GFR calc Af Amer: 68 mL/min/{1.73_m2} (ref >59)
GFR calc non Af Amer: 59 mL/min/{1.73_m2} — ABNORMAL LOW (ref >59)
Globulin, Total: 2.6 g/dL (ref 1.5–4.5)
Glucose: 170 mg/dL — ABNORMAL HIGH (ref 65–99)
Potassium: 4.3 mmol/L (ref 3.5–5.2)
Sodium: 138 mmol/L (ref 134–144)
Total Protein: 6.8 g/dL (ref 6.0–8.5)

## 2020-06-24 LAB — CBC/DIFF AMBIGUOUS DEFAULT
Basophils Absolute: 0.1 10*3/uL (ref 0.0–0.2)
Basos: 1 %
EOS (ABSOLUTE): 0.2 10*3/uL (ref 0.0–0.4)
Eos: 3 %
Hematocrit: 43.2 % (ref 37.5–51.0)
Hemoglobin: 14.9 g/dL (ref 13.0–17.7)
Immature Grans (Abs): 0 10*3/uL (ref 0.0–0.1)
Immature Granulocytes: 0 %
Lymphocytes Absolute: 3.1 10*3/uL (ref 0.7–3.1)
Lymphs: 41 %
MCH: 31 pg (ref 26.6–33.0)
MCHC: 34.5 g/dL (ref 31.5–35.7)
MCV: 90 fL (ref 79–97)
Monocytes Absolute: 0.7 10*3/uL (ref 0.1–0.9)
Monocytes: 10 %
Neutrophils Absolute: 3.5 10*3/uL (ref 1.4–7.0)
Neutrophils: 45 %
Platelets: 193 10*3/uL (ref 150–450)
RBC: 4.81 x10E6/uL (ref 4.14–5.80)
RDW: 11.8 % (ref 11.6–15.4)
WBC: 7.7 10*3/uL (ref 3.4–10.8)

## 2020-06-24 LAB — POCT GLYCOSYLATED HEMOGLOBIN (HGB A1C): Hemoglobin A1C: 7 % — AB (ref 4.0–5.6)

## 2020-06-24 LAB — PSA: Prostate Specific Ag, Serum: 0.2 ng/mL (ref 0.0–4.0)

## 2020-06-24 LAB — TESTOSTERONE,FREE AND TOTAL
Testosterone, Free: 0.3 pg/mL — ABNORMAL LOW (ref 7.2–24.0)
Testosterone: 46 ng/dL — ABNORMAL LOW (ref 264–916)

## 2020-06-24 LAB — SPECIMEN STATUS REPORT

## 2020-06-24 LAB — PROLACTIN: Prolactin: 9.7 ng/mL (ref 4.0–15.2)

## 2020-06-24 LAB — T4, FREE: Free T4: 1.44 ng/dL (ref 0.82–1.77)

## 2020-06-24 NOTE — Progress Notes (Signed)
06/24/2020   Endocrinology follow-up note    Subjective:    Patient ID: Ernest Graham, male    DOB: 1965-10-25.   He is returning for follow-up for multiple endocrine conditions as follows.    -Previously seen in consultation for  for multiple medical problems including type 2 diabetes, panhypopituitarism: Hypothyroidism, diabetes insipidus, hypogonadism .  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  He is on multiple hormone replacements including levothyroxine, hydrocortisone, desmopressin, testosterone.     He is  being treated for type 2 diabetes with metformin and Trulicity.  He has achieved reasonable control of type 2 diabetes with A1c of 7.7%.  He has slightly fluctuating body weight.  He admits that he has room to improve his dietary habits.    He denies heat or cold intolerance. He denies polyuria, nocturia, nor recent hospitalizations.  He has no new complaints today.   Past Medical History:  Diagnosis Date  . Adrenal disease (HCC)   . Diabetes mellitus, type II (HCC)   . Heart attack (HCC)   . Hyperlipidemia    Past Surgical History:  Procedure Laterality Date  . CORONARY ARTERY BYPASS GRAFT    . MITRAL VALVE REPAIR    . Removal of Cricopharyngeoma     Social History   Socioeconomic History  . Marital status: Married    Spouse name: Not on file  . Number of children: Not on file  . Years of education: Not on file  . Highest education level: Not on file  Occupational History  . Not on file  Tobacco Use  . Smoking status: Former Games developermoker  . Smokeless tobacco: Never Used  Vaping Use  . Vaping Use: Never used  Substance and Sexual Activity  . Alcohol use: No    Alcohol/week: 0.0 standard drinks  . Drug use: No  . Sexual activity: Not on file  Other Topics Concern  . Not on file  Social History Narrative  . Not on file   Social Determinants of Health   Financial Resource Strain:   .  Difficulty of Paying Living Expenses: Not on file  Food Insecurity:   . Worried About Programme researcher, broadcasting/film/videounning Out of Food in the Last Year: Not on file  . Ran Out of Food in the Last Year: Not on file  Transportation Needs:   . Lack of Transportation (Medical): Not on file  . Lack of Transportation (Non-Medical): Not on file  Physical Activity:   . Days of Exercise per Week: Not on file  . Minutes of Exercise per Session: Not on file  Stress:   . Feeling of Stress : Not on file  Social Connections:   . Frequency of Communication with Friends and Family: Not on file  . Frequency of Social Gatherings with Friends and Family: Not on file  . Attends Religious Services: Not on file  . Active Member of Clubs or Organizations: Not on file  . Attends BankerClub or Organization Meetings: Not on file  . Marital Status: Not on file   Outpatient Encounter Medications as of 06/24/2020  Medication Sig  . aspirin 325 MG tablet Take by mouth daily.   Marland Kitchen. atorvastatin (LIPITOR) 20 MG tablet TAKE 1 TABLET BY MOUTH EVERY DAY  . busPIRone (BUSPAR) 15 MG tablet Take 15 mg by mouth 2 (two) times daily.  . clonazePAM (KLONOPIN) 0.5 MG tablet Take 0.5 mg by mouth 3 (three) times daily as needed for anxiety.  . [facility] D3  125 MCG (5000 UT) capsule TAKE 1 CAPSULE BY MOUTH EVERY DAY  . desmopressin (DDAVP) 0.1 MG tablet Take 1 tablet (0.1 mg total) by mouth every 8 (eight) hours.  Marland Kitchen FLUoxetine (PROZAC) 20 MG tablet Take 20 mg by mouth daily.  . hydrocortisone (CORTEF) 5 MG tablet TAKE 2 TABLETS BY MOUTH TWICE A DAY  . levothyroxine (SYNTHROID) 112 MCG tablet TAKE 1 TABLET BY MOUTH EVERY MORNING BEFORE BREAKFAST  . metFORMIN (GLUCOPHAGE) 500 MG tablet TAKE 1 TABLET BY MOUTH TWICE A DAY WITH MEALS  . potassium chloride SA (K-DUR,KLOR-CON) 20 MEQ tablet Take 20 mEq by mouth daily.  . SYRINGE-NEEDLE, DISP, 3 ML (B-D 3CC LUER-LOK SYR 20GX1-1/2) 20G X 1-1/2" 3 ML MISC USE TO INJECT TESTOSTERONE EVERY WEEK  . testosterone cypionate  (DEPOTESTOSTERONE CYPIONATE) 200 MG/ML injection INJECT 0.5 MLS INTO THE MUSCLE EVERY 7 DAYS  . TRULICITY 1.5 MG/0.5ML SOPN INJECT 1.5 MG UNDER THE SKIN ONCE A WEEK   No facility-administered encounter medications on file as of 06/24/2020.   ALLERGIES: No Known Allergies VACCINATION STATUS:  There is no immunization history on file for this patient.  Diabetes He presents for his follow-up diabetic visit. He has type 2 diabetes mellitus. Onset time: He was diagnosed at approximate age of 46 years. His disease course has been improving. There are no hypoglycemic associated symptoms. Pertinent negatives for hypoglycemia include no confusion, headaches, pallor or seizures. Pertinent negatives for diabetes include no chest pain, no fatigue, no polydipsia, no polyphagia, no polyuria and no weakness. There are no hypoglycemic complications. Symptoms are improving. There are no diabetic complications. (Triple bypass and mitral valve repair.) Risk factors for coronary artery disease include diabetes mellitus, dyslipidemia, hypertension, obesity, tobacco exposure, sedentary lifestyle and male sex. Current diabetic treatment includes oral agent (monotherapy). He is compliant with treatment most of the time. His weight is fluctuating minimally. He is following a generally unhealthy diet. When asked about meal planning, he reported none. He has not had a previous visit with a dietitian. He never participates in exercise. (He presents today with no meter or logs, does not regularly check blood glucose at home due to his current medication regimen of Metformin and Trulicity.  His POCT A1C today is 7%, improving from 7.7% on last visit.  He denies any s/s of hypoglycemia.) An ACE inhibitor/angiotensin II receptor blocker is not being taken. He does not see a podiatrist.Eye exam is current.  Hyperlipidemia This is a chronic problem. The current episode started more than 1 year ago. The problem is uncontrolled. Recent  lipid tests were reviewed and are variable. Exacerbating diseases include chronic renal disease, diabetes, hypothyroidism and obesity. Factors aggravating his hyperlipidemia include fatty foods. Pertinent negatives include no chest pain, myalgias or shortness of breath. Current antihyperlipidemic treatment includes statins. The current treatment provides moderate improvement of lipids. There are no compliance problems.  Risk factors for coronary artery disease include diabetes mellitus, dyslipidemia, hypertension, male sex, obesity and a sedentary lifestyle.  Hypertension This is a chronic problem. The current episode started more than 1 year ago. The problem has been gradually improving since onset. The problem is controlled. Pertinent negatives include no chest pain, headaches, neck pain, palpitations or shortness of breath. Agents associated with hypertension include thyroid hormones and steroids. Risk factors for coronary artery disease include diabetes mellitus, dyslipidemia, male gender, obesity, smoking/tobacco exposure and sedentary lifestyle. Past treatments include nothing. There are no compliance problems.  Hypertensive end-organ damage includes kidney disease and CAD/MI. Identifiable causes of  hypertension include chronic renal disease and a thyroid problem.    Review of systems  Constitutional: + Minimally fluctuating body weight,  current  Body mass index is 34.7 kg/m. , + fatigue, no subjective hyperthermia, no subjective hypothermia Eyes: no blurry vision, no xerophthalmia ENT: no sore throat, no nodules palpated in throat, no dysphagia/odynophagia, no hoarseness Cardiovascular: no Chest Pain, no Shortness of Breath, no palpitations, no leg swelling Respiratory: no cough, no shortness of breath Gastrointestinal: no Nausea/Vomiting/Diarrhea Musculoskeletal: no muscle/joint aches Skin: no rashes, no hyperemia Neurological: no tremors, no numbness, no tingling, no dizziness Psychiatric:  no depression, no anxiety    Objective:    BP 122/80 (BP Location: Right Arm, Patient Position: Sitting)   Pulse 73   Ht 5\' 11"  (1.803 m)   Wt 248 lb 12.8 oz (112.9 kg)   BMI 34.70 kg/m   Wt Readings from Last 3 Encounters:  06/24/20 248 lb 12.8 oz (112.9 kg)  12/21/19 253 lb 12.8 oz (115.1 kg)  11/03/18 258 lb (117 kg)    BP Readings from Last 3 Encounters:  06/24/20 122/80  12/21/19 115/79  11/03/18 125/85    Physical Exam- Limited  Constitutional:  Body mass index is 34.7 kg/m. , not in acute distress, normal state of mind Eyes:  EOMI, no exophthalmos Neck: Supple Thyroid: No gross goiter Respiratory: Adequate breathing efforts Musculoskeletal: no gross deformities, strength intact in all four extremities, no gross restriction of joint movements Skin:  no rashes, no hyperemia Neurological: no tremor with outstretched hands,    - Summary of his pituitary/sella MRI from 08/13/2016 as follows - IMPRESSION: 1. Transsphenoidal resection of previous pituitary tumor without evidence for residual or recurrent neoplasm. 2. Residual enhancing pituitary tissue is within normal limits. 3. Slight rightward deviation of the pituitary stalk likely related to scarring and previous surgery. 4. Slight downward deviation of the optic chiasm, also likely related to prior surgery. 5. The suprasellar brain is normal for age. 6. Extensive sinus disease as described.  Recent Results (from the past 2160 hour(s))  Basic metabolic panel     Status: None   Collection Time: 06/19/20 12:00 AM  Result Value Ref Range   BUN 9 4 - 21   Creatinine 1.3 0.6 - 1.3  Comprehensive metabolic panel     Status: None   Collection Time: 06/19/20 12:00 AM  Result Value Ref Range   GFR calc Af Amer 68    GFR calc non Af Amer 59    Calcium 9.6 8.7 - 10.7  PSA     Status: None   Collection Time: 06/19/20 12:00 AM  Result Value Ref Range   PSA 0.2   Testosterone     Status: None   Collection Time:  06/19/20 12:00 AM  Result Value Ref Range   Testosterone 46   HgB A1c     Status: Abnormal   Collection Time: 06/24/20  8:46 AM  Result Value Ref Range   Hemoglobin A1C 7.0 (A) 4.0 - 5.6 %   HbA1c POC (<> result, manual entry)     HbA1c, POC (prediabetic range)     HbA1c, POC (controlled diabetic range)     Lipid Panel     Component Value Date/Time   CHOL 155 06/18/2019 0933   TRIG 247 (H) 06/18/2019 0933   HDL 23 (L) 06/18/2019 0933   CHOLHDL 6.5 (H) 08/12/2016 1018   LDLCALC 90 06/18/2019 0933    Assessment & Plan:   1. Type 2 diabetes with  vascular complications  - Patient has currently controlled asymptomatic type 2 DM since  54 years of age.    His diabetes is complicated by coronary artery disease status post triple bypass and mitral valve repair and patient remains at a high risk for more acute and chronic complications of diabetes which include CAD, CVA, CKD, retinopathy, and neuropathy. These are all discussed in detail with the patient.  He presents today with no meter or logs, does not regularly check blood glucose at home due to his current medication regimen of Metformin and Trulicity.  His POCT A1C today is 7%, improving from 7.7% on last visit.  He denies any s/s of hypoglycemia.  - Nutritional counseling repeated at each appointment due to patients tendency to fall back in to old habits.  - The patient admits there is a room for improvement in their diet and drink choices. -  Suggestion is made for the patient to avoid simple carbohydrates from their diet including Cakes, Sweet Desserts / Pastries, Ice Cream, Soda (diet and regular), Sweet Tea, Candies, Chips, Cookies, Sweet Pastries,  Store Bought Juices, Alcohol in Excess of  1-2 drinks a day, Artificial Sweeteners, Coffee Creamer, and "Sugar-free" Products. This will help patient to have stable blood glucose profile and potentially avoid unintended weight gain.   - I encouraged the patient to switch to   unprocessed or minimally processed complex starch and increased protein intake (animal or plant source), fruits, and vegetables.   - Patient is advised to stick to a routine mealtimes to eat 3 meals  a day and avoid unnecessary snacks ( to snack only to correct hypoglycemia).  - I have approached patient with the following individualized plan to manage diabetes and patient agrees:  -Given the improvement in his glycemic profile overall, he is advised to continue current regimen: Metformin 500 mg po twice daily with meals, and Trulicity 1.5 mg SQ weekly.  -He does not need to check glucose at home regularly at this time.  -He will be considered for low-dose glipizide on subsequent visits if necessary.  - Patient specific target  A1c;  LDL, HDL, Triglycerides, and  Waist Circumference were discussed in detail.  2) BP/HTN:  His blood pressure is controlled to target.  He is not currently on any antihypertensive medications at this time.  3) Lipids/HPL:  His most recent lipid panel froom 06/18/19 shows controlled LDL at 90 and high triglycerides of 247.  He is advised to continue Lipitor 20 mg po daily at bedtime.  Side effects and precautions discussed with him.  He is advised to avoid fried foods and butter.  4)  Weight/Diet:  His Body mass index is 34.7 kg/m.- Clearly complicating the care of his diabetes.  He is a candidate for modest weight loss.   CDE Consult has been requested, exercise, and detailed carbohydrates information provided.  6) Panhypopituitarism: R/t surgery for craniopharyngioma at approximate age of 29 years at [facility] [place].   He is on multiple hormonal replacements including levothyroxine 112 mcg p.o. daily before breakfast, Hydrocortisone 10 mg po twice daily, Desmopresssin 0.1 mg twice daily and Testosterone 100 mg IM every 7 days.  -Hypogonadism: He reports he sometimes misses his dose of testosterone.  He reports he missed his dose last week.  His recent  testosterone level was 46 on 06/19/20.  His PSA is stable and Hematocrit is 43.2. He is advised to be more consistent in injecting his testosterone.  Will recheck testosterone level prior to next  visit in 6 months.  -Hypothyroidism: His previsit thyroid function tests were consistent with appropriate replacement.  He is advised to continue Levothyroxine 112 mcg po daily before breakfast. - We discussed about the correct intake of his thyroid hormone, on empty stomach at fasting, with water, separated by at least 30 minutes from breakfast and other medications,  and separated by more than 4 hours from calcium, iron, multivitamins, acid reflux medications (PPIs). -Patient is made aware of the fact that thyroid hormone replacement is needed for life, dose to be adjusted by periodic monitoring of thyroid function tests.  - His repeat MRI of sella/pituitary in 2017- is consistent with postsurgical changes with no new lesion. Summaries dictated,  see above.   - He is status post sinus drainage by ENT with clinical improvement. He does not have any clinical complaints of mass-effect at this time.  7) Vitamin D deficiency: His most recent vitamin D level was 36.3 on 06/18/2019.  He has since completed repletion.  Will consider rechecking vitamin D level on subsequent visits.  8) Chronic Care/Health Maintenance: -Patient on Statin medications and encouraged to continue to follow up with Ophthalmology, Podiatrist at least yearly or according to recommendations, and advised to   stay away from smoking. I have recommended yearly flu vaccine and pneumonia vaccination at least every 5 years; moderate intensity exercise for up to 150 minutes weekly; and  sleep for at least 7 hours a day.    - I advised patient to maintain close follow up with Alinda Deem, MD for primary care needs.  - Time spent on this patient care encounter:  35 min, of which > 50% was spent in  counseling and the rest reviewing his blood  glucose logs , discussing his hypoglycemia and hyperglycemia episodes, reviewing his current and  previous labs / studies  ( including abstraction from other facilities) and medications  doses and developing a  long term treatment plan and documenting his care.   Please refer to Patient Instructions for Blood Glucose Monitoring and Insulin/Medications Dosing Guide"  in media tab for additional information. Please  also refer to " Patient Self Inventory" in the Media  tab for reviewed elements of pertinent patient history.  Ernest Done participated in the discussions, expressed understanding, and voiced agreement with the above plans.  All questions were answered to his satisfaction. he is encouraged to contact clinic should he have any questions or concerns prior to his return visit.   Follow up plan: - Return in about 6 months (around 12/23/2020) for Diabetes follow up, Thyroid follow up, Previsit labs.  Ronny Bacon, [facility] [address] [place], [place] [address] Phone: [phone] Fax: [phone] 06/24/2020, 9:12 AM

## 2020-06-24 NOTE — Patient Instructions (Signed)

## 2020-07-01 ENCOUNTER — Other Ambulatory Visit: Payer: Self-pay | Admitting: "Endocrinology

## 2020-07-01 DIAGNOSIS — E230 Hypopituitarism: Secondary | ICD-10-CM

## 2020-07-01 DIAGNOSIS — E291 Testicular hypofunction: Secondary | ICD-10-CM

## 2020-07-10 ENCOUNTER — Other Ambulatory Visit: Payer: Self-pay | Admitting: "Endocrinology

## 2020-08-05 ENCOUNTER — Other Ambulatory Visit: Payer: Self-pay | Admitting: "Endocrinology

## 2020-08-18 ENCOUNTER — Other Ambulatory Visit: Payer: Self-pay | Admitting: "Endocrinology

## 2020-08-19 ENCOUNTER — Other Ambulatory Visit: Payer: Self-pay | Admitting: "Endocrinology

## 2020-08-19 ENCOUNTER — Telehealth: Payer: Self-pay

## 2020-08-19 MED ORDER — DESMOPRESSIN ACETATE 0.1 MG PO TABS
0.1000 mg | ORAL_TABLET | Freq: Three times a day (TID) | ORAL | 1 refills | Status: DC
Start: 2020-08-19 — End: 2021-02-10

## 2020-08-19 NOTE — Telephone Encounter (Signed)
I will send a new rx.

## 2020-08-19 NOTE — Telephone Encounter (Signed)
Pt made aware

## 2020-08-19 NOTE — Telephone Encounter (Signed)
Patient said he is coming up short on his desmopressin (DDAVP) 0.1 MG tablet. He said that he has been taking it 3x daily but the bottle says 2x daily. Please advise.

## 2020-09-26 ENCOUNTER — Other Ambulatory Visit: Payer: Self-pay | Admitting: "Endocrinology

## 2020-09-26 ENCOUNTER — Other Ambulatory Visit: Payer: Self-pay | Admitting: Nurse Practitioner

## 2020-10-13 ENCOUNTER — Other Ambulatory Visit: Payer: Self-pay | Admitting: "Endocrinology

## 2020-11-12 ENCOUNTER — Other Ambulatory Visit: Payer: Self-pay | Admitting: "Endocrinology

## 2020-12-04 ENCOUNTER — Other Ambulatory Visit: Payer: Self-pay | Admitting: "Endocrinology

## 2020-12-19 LAB — TESTOSTERONE FREE, PROFILE I
Sex Hormone Binding: 28.5 nmol/L (ref 19.3–76.4)
Testost., Free, Calc: 30.8 pg/mL — ABNORMAL LOW (ref 35.8–168.2)
Testosterone: 153 ng/dL — ABNORMAL LOW (ref 264–916)

## 2020-12-19 LAB — COMPREHENSIVE METABOLIC PANEL
ALT: 22 IU/L (ref 0–44)
AST: 18 IU/L (ref 0–40)
Albumin/Globulin Ratio: 1.7 (ref 1.2–2.2)
Albumin: 4.4 g/dL (ref 3.8–4.9)
Alkaline Phosphatase: 98 IU/L (ref 44–121)
BUN/Creatinine Ratio: 9 (ref 9–20)
BUN: 13 mg/dL (ref 6–24)
Bilirubin Total: 0.4 mg/dL (ref 0.0–1.2)
CO2: 20 mmol/L (ref 20–29)
Calcium: 9.6 mg/dL (ref 8.7–10.2)
Chloride: 102 mmol/L (ref 96–106)
Creatinine, Ser: 1.4 mg/dL — ABNORMAL HIGH (ref 0.76–1.27)
Globulin, Total: 2.6 g/dL (ref 1.5–4.5)
Glucose: 164 mg/dL — ABNORMAL HIGH (ref 65–99)
Potassium: 4.6 mmol/L (ref 3.5–5.2)
Sodium: 143 mmol/L (ref 134–144)
Total Protein: 7 g/dL (ref 6.0–8.5)
eGFR: 59 mL/min/{1.73_m2} — ABNORMAL LOW (ref >59)

## 2020-12-19 LAB — PSA: Prostate Specific Ag, Serum: 0.3 ng/mL (ref 0.0–4.0)

## 2020-12-19 LAB — PROLACTIN: Prolactin: 9.2 ng/mL (ref 4.0–15.2)

## 2020-12-19 LAB — VITAMIN D 25 HYDROXY (VIT D DEFICIENCY, FRACTURES): Vit D, 25-Hydroxy: 20.3 ng/mL — ABNORMAL LOW (ref 30.0–100.0)

## 2020-12-19 LAB — T4, FREE: Free T4: 1.52 ng/dL (ref 0.82–1.77)

## 2020-12-19 LAB — TSH: TSH: <0.005 u[IU]/mL — ABNORMAL LOW (ref 0.450–4.500)

## 2020-12-24 ENCOUNTER — Other Ambulatory Visit: Payer: Self-pay

## 2020-12-24 ENCOUNTER — Ambulatory Visit (INDEPENDENT_AMBULATORY_CARE_PROVIDER_SITE_OTHER): Payer: 59 | Admitting: "Endocrinology

## 2020-12-24 ENCOUNTER — Encounter: Payer: Self-pay | Admitting: "Endocrinology

## 2020-12-24 VITALS — BP 141/89 | HR 71 | Ht 71.0 in | Wt 252.8 lb

## 2020-12-24 DIAGNOSIS — E1159 Type 2 diabetes mellitus with other circulatory complications: Secondary | ICD-10-CM | POA: Diagnosis not present

## 2020-12-24 DIAGNOSIS — E890 Postprocedural hypothyroidism: Secondary | ICD-10-CM | POA: Insufficient documentation

## 2020-12-24 DIAGNOSIS — E232 Diabetes insipidus: Secondary | ICD-10-CM | POA: Insufficient documentation

## 2020-12-24 DIAGNOSIS — E559 Vitamin D deficiency, unspecified: Secondary | ICD-10-CM | POA: Diagnosis not present

## 2020-12-24 DIAGNOSIS — E291 Testicular hypofunction: Secondary | ICD-10-CM

## 2020-12-24 DIAGNOSIS — E230 Hypopituitarism: Secondary | ICD-10-CM | POA: Diagnosis not present

## 2020-12-24 LAB — POCT GLYCOSYLATED HEMOGLOBIN (HGB A1C): HbA1c, POC (controlled diabetic range): 7.9 % — AB (ref 0.0–7.0)

## 2020-12-24 MED ORDER — TESTOSTERONE CYPIONATE 200 MG/ML IM SOLN: INTRAMUSCULAR | 0 refills | Status: DC

## 2020-12-24 MED ORDER — TRULICITY 3 MG/0.5ML ~~LOC~~ SOAJ: 3.0000 mg | SUBCUTANEOUS | 1 refills | Status: DC

## 2020-12-24 NOTE — Progress Notes (Signed)
12/24/2020   Endocrinology follow-up note    Subjective:    Patient ID: Ernest Graham, male    DOB: 06/14/1966.   He is returning for follow-up for multiple endocrine conditions as follows.    -Previously seen in consultation for  for multiple medical problems including type 2 diabetes, panhypopituitarism: Hypothyroidism, diabetes insipidus, hypogonadism .  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  He is on multiple hormone replacements including levothyroxine, hydrocortisone, desmopressin, testosterone.     He is  being treated for type 2 diabetes with metformin and Trulicity.  He has achieved reasonable control of type 2 diabetes with A1c of 7.7%.  He has slightly fluctuating body weight.  He admits that he has room to improve his dietary habits.    He denies heat or cold intolerance. He denies polyuria, nocturia, nor recent hospitalizations.  He has no new complaints today.   Past Medical History:  Diagnosis Date  . Adrenal disease ([place])   . Diabetes mellitus, type II ([place])   . Heart attack ([place])   . Hyperlipidemia    Past Surgical History:  Procedure Laterality Date  . CORONARY ARTERY BYPASS GRAFT    . MITRAL VALVE REPAIR    . Removal of Cricopharyngeoma     Social History   Socioeconomic History  . Marital status: Married    Spouse name: Not on file  . Number of children: Not on file  . Years of education: Not on file  . Highest education level: Not on file  Occupational History  . Not on file  Tobacco Use  . Smoking status: Former Research scientist (life sciences)  . Smokeless tobacco: Never Used  Vaping Use  . Vaping Use: Never used  Substance and Sexual Activity  . Alcohol use: No    Alcohol/week: 0.0 standard drinks  . Drug use: No  . Sexual activity: Not on file  Other Topics Concern  . Not on file  Social History Narrative  . Not on file   Social Determinants of Health   Financial Resource Strain: Not on  file  Food Insecurity: Not on file  Transportation Needs: Not on file  Physical Activity: Not on file  Stress: Not on file  Social Connections: Not on file   Outpatient Encounter Medications as of 12/24/2020  Medication Sig  . Dulaglutide (TRULICITY) 3 QQ/2.2LN SOPN Inject 3 mg as directed once a week.  Marland Kitchen aspirin 325 MG tablet Take by mouth daily.   Marland Kitchen atorvastatin (LIPITOR) 20 MG tablet TAKE 1 TABLET BY MOUTH EVERY DAY  . busPIRone (BUSPAR) 15 MG tablet Take 15 mg by mouth 2 (two) times daily.  . clonazePAM (KLONOPIN) 0.5 MG tablet Take 0.5 mg by mouth 3 (three) times daily as needed for anxiety.  . [employer] D3 125 MCG (5000 UT) capsule TAKE 1 CAPSULE BY MOUTH EVERY DAY  . desmopressin (DDAVP) 0.1 MG tablet Take 1 tablet (0.1 mg total) by mouth in the morning, at noon, and at bedtime.  Marland Kitchen FLUoxetine (PROZAC) 20 MG tablet Take 20 mg by mouth daily.  . hydrocortisone (CORTEF) 5 MG tablet TAKE 2 TABLETS BY MOUTH TWICE A DAY  . icosapent Ethyl (VASCEPA) 1 g capsule Take 2 g by mouth 2 (two) times daily.  Marland Kitchen levothyroxine (SYNTHROID) 112 MCG tablet TAKE 1 TABLET BY MOUTH EVERY DAY BEFORE BREAKFAST  . metFORMIN (GLUCOPHAGE) 500 MG tablet TAKE 1 TABLET BY MOUTH TWICE A DAY WITH MEALS  . potassium chloride SA (K-DUR,KLOR-CON)  20 MEQ tablet Take 20 mEq by mouth daily.  . SYRINGE-NEEDLE, DISP, 3 ML (B-D 3CC LUER-LOK SYR 20GX1-1/2) 20G X 1-1/2" 3 ML MISC USE TO INJECT TESTOSTERONE EVERY WEEK  . testosterone cypionate (DEPOTESTOSTERONE CYPIONATE) 200 MG/ML injection INJECT 0.5 MLS INTO THE MUSCLES EVERY 7 DAYS  . [DISCONTINUED] testosterone cypionate (DEPOTESTOSTERONE CYPIONATE) 200 MG/ML injection INJECT 0.5 MLS INTO THE MUSCLES EVERY 7 DAYS  . [DISCONTINUED] TRULICITY 1.5 RX/5.4MG SOPN INJECT 1.5 MG UNDER THE SKIN ONCE A WEEK   No facility-administered encounter medications on file as of 12/24/2020.   ALLERGIES: No Known Allergies VACCINATION STATUS:  There is no immunization history on file for this  patient.  Diabetes He presents for his follow-up diabetic visit. He has type 2 diabetes mellitus. Onset time: He was diagnosed at approximate age of 42 years. His disease course has been worsening. There are no hypoglycemic associated symptoms. Pertinent negatives for hypoglycemia include no confusion, headaches, pallor or seizures. Pertinent negatives for diabetes include no chest pain, no fatigue, no polydipsia, no polyphagia, no polyuria and no weakness. There are no hypoglycemic complications. Symptoms are worsening. There are no diabetic complications. (Triple bypass and mitral valve repair.) Risk factors for coronary artery disease include diabetes mellitus, dyslipidemia, hypertension, obesity, tobacco exposure, sedentary lifestyle and male sex. Current diabetic treatment includes oral agent (monotherapy). He is compliant with treatment most of the time. His weight is fluctuating minimally. He is following a generally unhealthy diet. When asked about meal planning, he reported none. He has not had a previous visit with a dietitian. He never participates in exercise. (He presents today with no meter nor logs.  His point-of-care A1c 7.9%, progressively increasing from 7%.  He denies hypoglycemia.   ) An ACE inhibitor/angiotensin II receptor blocker is not being taken. He does not see a podiatrist.Eye exam is current.  Hyperlipidemia This is a chronic problem. The current episode started more than 1 year ago. The problem is uncontrolled. Recent lipid tests were reviewed and are variable. Exacerbating diseases include chronic renal disease, diabetes, hypothyroidism and obesity. Factors aggravating his hyperlipidemia include fatty foods. Pertinent negatives include no chest pain, myalgias or shortness of breath. Current antihyperlipidemic treatment includes statins. The current treatment provides moderate improvement of lipids. There are no compliance problems.  Risk factors for coronary artery disease  include diabetes mellitus, dyslipidemia, hypertension, male sex, obesity and a sedentary lifestyle.  Hypertension This is a chronic problem. The current episode started more than 1 year ago. The problem has been gradually improving since onset. The problem is controlled. Pertinent negatives include no chest pain, headaches, neck pain, palpitations or shortness of breath. Agents associated with hypertension include thyroid hormones and steroids. Risk factors for coronary artery disease include diabetes mellitus, dyslipidemia, male gender, obesity, smoking/tobacco exposure and sedentary lifestyle. Past treatments include nothing. There are no compliance problems.  Hypertensive end-organ damage includes kidney disease and CAD/MI. Identifiable causes of hypertension include chronic renal disease and a thyroid problem.    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 He is on multiple hormonal replacement therapy including levothyroxine, hydrocortisone, desmopressin, testosterone.  He has no new complaints regarding this diagnosis at  today's visit. He admits that he has not been consistent injecting his testosterone weekly.  His previsit labs show total testosterone 153, suboptimal also increasing from 46.  Review of systems  Constitutional: + Minimally fluctuating body weight,  current  Body  mass index is 35.26 kg/m. , + fatigue, no subjective hyperthermia, no subjective hypothermia     Objective:    BP (!) 141/89   Pulse 71   Ht $R'5\' 11"'Fa$  (1.803 m)   Wt 252 lb 12.8 oz (114.7 kg)   BMI 35.26 kg/m   Wt Readings from Last 3 Encounters:  12/24/20 252 lb 12.8 oz (114.7 kg)  06/24/20 248 lb 12.8 oz (112.9 kg)  12/21/19 253 lb 12.8 oz (115.1 kg)    BP Readings from Last 3 Encounters:  12/24/20 (!) 141/89  06/24/20 122/80  12/21/19 115/79    Physical Exam- Limited  Constitutional:   Body mass index is 35.26 kg/m. , not in acute distress, normal state of mind    - Summary of his pituitary/sella MRI from 08/13/2016 as follows - IMPRESSION: 1. Transsphenoidal resection of previous pituitary tumor without evidence for residual or recurrent neoplasm. 2. Residual enhancing pituitary tissue is within normal limits. 3. Slight rightward deviation of the pituitary stalk likely related to scarring and previous surgery. 4. Slight downward deviation of the optic chiasm, also likely related to prior surgery. 5. The suprasellar brain is normal for age. 6. Extensive sinus disease as described.  Recent Results (from the past 2160 hour(s))  Comprehensive metabolic panel     Status: Abnormal   Collection Time: 12/18/20  8:10 AM  Result Value Ref Range   Glucose 164 (H) 65 - 99 mg/dL   BUN 13 6 - 24 mg/dL   Creatinine, Ser 1.40 (H) 0.76 - 1.27 mg/dL   eGFR 59 (L) >59 mL/min/1.73   BUN/Creatinine Ratio 9 9 - 20   Sodium 143 134 - 144 mmol/L   Potassium 4.6 3.5 - 5.2 mmol/L   Chloride 102 96 - 106 mmol/L   CO2 20 20 - 29 mmol/L   Calcium 9.6 8.7 - 10.2 mg/dL   Total Protein 7.0 6.0 - 8.5 g/dL   Albumin 4.4 3.8 - 4.9 g/dL   Globulin, Total 2.6 1.5 - 4.5 g/dL   Albumin/Globulin Ratio 1.7 1.2 - 2.2   Bilirubin Total 0.4 0.0 - 1.2 mg/dL   Alkaline Phosphatase 98 44 - 121 IU/L   AST 18 0 - 40 IU/L   ALT 22 0 - 44 IU/L  TSH     Status: Abnormal   Collection Time: 12/18/20  8:10 AM  Result Value Ref Range   TSH <0.005 (L) 0.450 - 4.500 uIU/mL  T4, free     Status: None   Collection Time: 12/18/20  8:10 AM  Result Value Ref Range   Free T4 1.52 0.82 - 1.77 ng/dL  VITAMIN D 25 Hydroxy (Vit-D Deficiency, Fractures)     Status: Abnormal   Collection Time: 12/18/20  8:10 AM  Result Value Ref Range   Vit D, 25-Hydroxy 20.3 (L) 30.0 - 100.0 ng/mL    Comment: Vitamin D deficiency has been defined by the Institute of Medicine and an Endocrine Society practice guideline as a level  of serum 25-OH vitamin D less than 20 ng/mL (1,2). The Endocrine Society went on to further define vitamin D insufficiency as a level between 21 and 29 ng/mL (2). 1. IOM (Institute of Medicine). 2010. Dietary reference    intakes for calcium and D. [place]: The    [employer]. 2. Holick MF, Binkley Bradford, Bischoff-Ferrari HA, et al.    Evaluation, treatment, and prevention of vitamin D    deficiency: an Endocrine Society clinical practice    guideline. JCEM.  2011 Jul; 96(7):1911-30.   PSA     Status: None   Collection Time: 12/18/20  8:10 AM  Result Value Ref Range   Prostate Specific Ag, Serum 0.3 0.0 - 4.0 ng/mL    Comment: Roche ECLIA methodology. According to the American Urological Association, Serum PSA should decrease and remain at undetectable levels after radical prostatectomy. The AUA defines biochemical recurrence as an initial PSA value 0.2 ng/mL or greater followed by a subsequent confirmatory PSA value 0.2 ng/mL or greater. Values obtained with different assay methods or kits cannot be used interchangeably. Results cannot be interpreted as absolute evidence of the presence or absence of malignant disease.   Testosterone Free, Profile I     Status: Abnormal   Collection Time: 12/18/20  8:10 AM  Result Value Ref Range   Testosterone 153 (L) 264 - 916 ng/dL    Comment: Adult male reference interval is based on a population of healthy nonobese males (BMI <30) between 56 and 86 years old. [place], [place] [phone]. PMID: 17793903.    Sex Hormone Binding 28.5 19.3 - 76.4 nmol/L   Testost., Free, Calc 30.8 (L) 35.8 - 168.2 pg/mL  Prolactin     Status: None   Collection Time: 12/18/20  8:10 AM  Result Value Ref Range   Prolactin 9.2 4.0 - 15.2 ng/mL  HgB A1c     Status: Abnormal   Collection Time: 12/24/20  8:55 AM  Result Value Ref Range   Hemoglobin A1C     HbA1c POC (<> result, manual entry)     HbA1c, POC (prediabetic range)      HbA1c, POC (controlled diabetic range) 7.9 (A) 0.0 - 7.0 %   Lipid Panel     Component Value Date/Time   CHOL 155 06/18/2019 0933   TRIG 247 (H) 06/18/2019 0933   HDL 23 (L) 06/18/2019 0933   CHOLHDL 6.5 (H) 08/12/2016 1018   LDLCALC 90 06/18/2019 0933    Assessment & Plan:   1. Type 2 diabetes with vascular complications  - Patient has currently controlled asymptomatic type 2 DM since  55 years of age.    His diabetes is complicated by complicated by coronary artery disease status post triple bypass and mitral valve repair and patient remains at exceedingly high risk for more acute and chronic complications of diabetes  which include CAD, CVA, CKD, retinopathy, and neuropathy. These are all discussed in detail with the patient.  He presents today with no meter or logs, does not regularly check blood glucose at home due to his current medication regimen of Metformin and Trulicity.  His POCT A1C today is 7%, improving from 7.7% on last visit.  He denies any s/s of hypoglycemia.  - Nutritional counseling repeated at each appointment due to patients tendency to fall back in to old habits.  - he acknowledges that there is a room for improvement in his food and drink choices. - Suggestion is made for him to avoid simple carbohydrates  from his diet including Cakes, Sweet Desserts, Ice Cream, Soda (diet and regular), Sweet Tea, Candies, Chips, Cookies, Store Bought Juices, Alcohol in Excess of  1-2 drinks a day, Artificial Sweeteners,  Coffee Creamer, and "Sugar-free" Products, Lemonade. This will help patient to have more stable blood glucose profile and potentially avoid unintended weight gain.    - I encouraged the patient to switch to  unprocessed or minimally processed complex starch and increased protein intake (animal or plant source), fruits, and vegetables.   -  Patient is advised to stick to a routine mealtimes to eat 3 meals  a day and avoid unnecessary snacks ( to snack only to  correct hypoglycemia).  - I have approached patient with the following individualized plan to manage diabetes and patient agrees:  -Given his presentation with above target glycemic profile, his regimen will be adjusted as follows. -   I discussed and increased his Trulicity to 3 mg subcutaneously weekly.  He is advised to continue Metformin 500 mg p.o. twice daily.    -He does not need to check glucose at home regularly at this time.  -He will be considered for low-dose glipizide on subsequent visits if his A1c remains above 7%.   - Patient specific target  A1c;  LDL, HDL, Triglycerides, and  Waist Circumference were discussed in detail.  2) BP/HTN:  -His blood pressure is not controlled to target.  He is not currently on any antihypertensive medications at this time.  He is engaged in exercise program to manage it, will be considered for low-dose lisinopril on subsequent visits.  3) Lipids/HPL:  His most recent lipid panel [facility] 06/18/19 shows controlled LDL at 90 and high triglycerides of 247.  He admits for inconsistency with Lipitor.  He is advised to continue Lipitor 20 mg p.o. nightly.  Side effects and precautions discussed with him.  He is advised to avoid fried foods and butter.  4)  Weight/Diet:  His Body mass index is 35.26 kg/m.- Clearly complicating the care of his diabetes.  He is a candidate for modest weight loss.   CDE Consult has been requested, exercise, and detailed carbohydrates information provided.  6) Panhypopituitarism: R/t surgery for craniopharyngioma at approximate age of 50 years at [facility] [place].    He has adrenal insufficiency, diabetes insipidus, hypothyroidism, and hypogonadism as a result. He is on multiple hormonal replacements including levothyroxine 112 mcg p.o. daily before breakfast, Hydrocortisone 10 mg po twice daily, Desmopresssin 0.1 mg 3 times daily, and testosterone 100 mg IM every 7 days.    -Hypogonadism: He reports he sometimes  misses his dose of testosterone.   His recent testosterone level was 153 also improving from 46, still suboptimal.  He does not have contraindication for optimizing his testosterone treatment.    He wishes to be continued on testosterone replacement therapy he is advised to resume 100 mg IM every 7 days.  His PSA is stable and Hematocrit is 43.2. He is advised to be more consistent in injecting his testosterone.  He will need repeat testosterone measurement prior to his next visit.     -Hypothyroidism: His previsit thyroid function tests were consistent with appropriate replacement.  TSH is unreliable in his care.  According to free T4 his previsit labs are on target.    He is advised to continue Levothyroxine 112 mcg po daily before breakfast.  - We discussed about the correct intake of his thyroid hormone, on empty stomach at fasting, with water, separated by at least 30 minutes from breakfast and other medications,  and separated by more than 4 hours from calcium, iron, multivitamins, acid reflux medications (PPIs). -Patient is made aware of the fact that thyroid hormone replacement is needed for life, dose to be adjusted by periodic monitoring of thyroid function tests.   - His repeat MRI of sella/pituitary in 2017- is consistent with postsurgical changes with no new lesion. Summaries dictated,  see above.   - He is status post sinus drainage by ENT with clinical improvement. He  does not have any clinical complaints of mass-effect at this time.  7) Vitamin D deficiency: His most recent vitamin D level was 36.3 on 06/18/2019.  He has since completed repletion.  Will consider rechecking vitamin D level on subsequent visits.    8) Chronic Care/Health Maintenance: -His BMI is 35.2-a candidate for modest weight loss.   Patient on Statin medications and encouraged to continue to follow up with Ophthalmology, Podiatrist at least yearly or according to recommendations, and advised to   stay away  from smoking. I have recommended yearly flu vaccine and pneumonia vaccination at least every 5 years; moderate intensity exercise for up to 150 minutes weekly; and  sleep for at least 7 hours a day.  POCT ABI Results 12/24/20  His screening ABI is negative for PAD today December 24, 2020.  His next study is in April 2027, or sooner if needed. Right ABI: 1.18      left ABI: 1.21  Right leg systolic / diastolic: 694/854 mmHg Left leg systolic / diastolic: 627/03 mmHg  Arm systolic / diastolic: 500/93 mmHG   - I advised patient to maintain close follow up with Earney Mallet, MD for primary care needs.    I spent 50 minutes in the care of the patient today including review of labs from [facility], Lipids, Thyroid Function, Hematology (current and previous including abstractions from other facilities); face-to-face time discussing  his blood glucose readings/logs, discussing hypoglycemia and hyperglycemia episodes and symptoms, medications doses, his options of short and long term treatment based on the latest standards of care / guidelines;  discussion about incorporating lifestyle medicine;  and documenting the encounter.    Please refer to Patient Instructions for Blood Glucose Monitoring and Insulin/Medications Dosing Guide"  in media tab for additional information. Please  also refer to " Patient Self Inventory" in the Media  tab for reviewed elements of pertinent patient history. We discussed diabetes management in depth, panhypopituitarism, hypogonadism, hypothyroidism, hyperlipidemia. Raymond Gurney participated in the discussions, expressed understanding, and voiced agreement with the above plans.  All questions were answered to his satisfaction. he is encouraged to contact clinic should he have any questions or concerns prior to his return visit.    Follow up plan: - Return in about 4 months (around 04/25/2021) for F/U with Pre-visit Labs, A1c -NV, Fasting Labs  in AM B4 8.  Rayetta Pigg,  [facility] [address] [place], [place] [address] Phone: [phone] Fax: [phone] 12/24/2020, 2:50 PM

## 2020-12-24 NOTE — Patient Instructions (Signed)

## 2021-01-10 ENCOUNTER — Other Ambulatory Visit: Payer: Self-pay | Admitting: "Endocrinology

## 2021-01-19 LAB — BASIC METABOLIC PANEL
BUN: 14 (ref 4–21)
CO2: 19 (ref 13–22)
Chloride: 102 (ref 99–108)
Creatinine: 1.3 (ref 0.6–1.3)
Glucose: 248
Potassium: 5.1 (ref 3.4–5.3)
Sodium: 139 (ref 137–147)

## 2021-01-19 LAB — COMPREHENSIVE METABOLIC PANEL
Albumin: 4.2 (ref 3.5–5.0)
Calcium: 9.4 (ref 8.7–10.7)
Globulin: 3.1

## 2021-01-19 LAB — HEPATIC FUNCTION PANEL
ALT: 34 (ref 10–40)
AST: 27 (ref 14–40)
Alkaline Phosphatase: 101 (ref 25–125)
Bilirubin, Total: 0.5

## 2021-01-19 LAB — LIPID PANEL
Cholesterol: 172 (ref 0–200)
HDL: 22 — AB (ref 35–70)
LDL Cholesterol: 96
Triglycerides: 319 — AB (ref 40–160)

## 2021-01-19 LAB — TSH: TSH: 0.01 — AB (ref 0.41–5.90)

## 2021-01-19 LAB — HEMOGLOBIN A1C: Hemoglobin A1C: 7.8

## 2021-02-08 ENCOUNTER — Other Ambulatory Visit: Payer: Self-pay | Admitting: "Endocrinology

## 2021-02-26 ENCOUNTER — Other Ambulatory Visit: Payer: Self-pay | Admitting: "Endocrinology

## 2021-02-27 ENCOUNTER — Other Ambulatory Visit: Payer: Self-pay | Admitting: "Endocrinology

## 2021-03-12 ENCOUNTER — Other Ambulatory Visit: Payer: Self-pay | Admitting: Nurse Practitioner

## 2021-03-19 ENCOUNTER — Other Ambulatory Visit: Payer: Self-pay | Admitting: Nurse Practitioner

## 2021-04-09 ENCOUNTER — Other Ambulatory Visit: Payer: Self-pay | Admitting: "Endocrinology

## 2021-04-29 LAB — COMPREHENSIVE METABOLIC PANEL
ALT: 22 IU/L (ref 0–44)
AST: 16 IU/L (ref 0–40)
Albumin/Globulin Ratio: 1.9 (ref 1.2–2.2)
Albumin: 4.3 g/dL (ref 3.8–4.9)
Alkaline Phosphatase: 85 IU/L (ref 44–121)
BUN/Creatinine Ratio: 8 — ABNORMAL LOW (ref 9–20)
BUN: 10 mg/dL (ref 6–24)
Bilirubin Total: 0.4 mg/dL (ref 0.0–1.2)
CO2: 24 mmol/L (ref 20–29)
Calcium: 9.1 mg/dL (ref 8.7–10.2)
Chloride: 104 mmol/L (ref 96–106)
Creatinine, Ser: 1.33 mg/dL — ABNORMAL HIGH (ref 0.76–1.27)
Globulin, Total: 2.3 g/dL (ref 1.5–4.5)
Glucose: 106 mg/dL — ABNORMAL HIGH (ref 65–99)
Potassium: 4.5 mmol/L (ref 3.5–5.2)
Sodium: 138 mmol/L (ref 134–144)
Total Protein: 6.6 g/dL (ref 6.0–8.5)
eGFR: 63 mL/min/{1.73_m2} (ref >59)

## 2021-04-29 LAB — TESTOSTERONE, FREE, TOTAL, SHBG
Sex Hormone Binding: 26.5 nmol/L (ref 19.3–76.4)
Testosterone, Free: 5.5 pg/mL — ABNORMAL LOW (ref 7.2–24.0)
Testosterone: 475 ng/dL (ref 264–916)

## 2021-04-29 LAB — T4, FREE: Free T4: 1.63 ng/dL (ref 0.82–1.77)

## 2021-04-29 LAB — PSA: Prostate Specific Ag, Serum: 0.3 ng/mL (ref 0.0–4.0)

## 2021-04-30 ENCOUNTER — Ambulatory Visit (INDEPENDENT_AMBULATORY_CARE_PROVIDER_SITE_OTHER): Payer: 59 | Admitting: "Endocrinology

## 2021-04-30 ENCOUNTER — Encounter: Payer: Self-pay | Admitting: "Endocrinology

## 2021-04-30 ENCOUNTER — Other Ambulatory Visit: Payer: Self-pay

## 2021-04-30 VITALS — BP 110/72 | HR 64 | Ht 71.0 in | Wt 245.6 lb

## 2021-04-30 DIAGNOSIS — E291 Testicular hypofunction: Secondary | ICD-10-CM

## 2021-04-30 DIAGNOSIS — E559 Vitamin D deficiency, unspecified: Secondary | ICD-10-CM

## 2021-04-30 DIAGNOSIS — E230 Hypopituitarism: Secondary | ICD-10-CM | POA: Diagnosis not present

## 2021-04-30 DIAGNOSIS — E1159 Type 2 diabetes mellitus with other circulatory complications: Secondary | ICD-10-CM

## 2021-04-30 DIAGNOSIS — E232 Diabetes insipidus: Secondary | ICD-10-CM

## 2021-04-30 LAB — POCT GLYCOSYLATED HEMOGLOBIN (HGB A1C): HbA1c, POC (controlled diabetic range): 5.9 % (ref 0.0–7.0)

## 2021-04-30 NOTE — Progress Notes (Signed)
04/30/2021   Endocrinology follow-up note    Subjective:    Patient ID: Ernest Graham, male    DOB: [birth year].   He is returning for follow-up for multiple endocrine conditions as follows.    -Previously seen in consultation for  for multiple medical problems including type 2 diabetes, panhypopituitarism: Hypothyroidism, diabetes insipidus, hypogonadism .  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  He is on multiple hormone replacements including levothyroxine, hydrocortisone, desmopressin, testosterone.     He is  being treated for type 2 diabetes with metformin and Trulicity.  He has achieved reasonable control of type 2 diabetes with A1c of 7.7%.  He has slightly fluctuating body weight.  He admits that he has room to improve his dietary habits.    He denies heat or cold intolerance. He denies polyuria, nocturia, nor recent hospitalizations.  He has no new complaints today.   Past Medical History:  Diagnosis Date   Adrenal disease ([place])    Diabetes mellitus, type II ([place])    Heart attack ([place])    Hyperlipidemia    Past Surgical History:  Procedure Laterality Date   CORONARY ARTERY BYPASS GRAFT     MITRAL VALVE REPAIR     Removal of Cricopharyngeoma     Social History   Socioeconomic History   Marital status: Married    Spouse name: Not on file   Number of children: Not on file   Years of education: Not on file   Highest education level: Not on file  Occupational History   Not on file  Tobacco Use   Smoking status: Former   Smokeless tobacco: Never  Vaping Use   Vaping Use: Never used  Substance and Sexual Activity   Alcohol use: No    Alcohol/week: 0.0 standard drinks   Drug use: No   Sexual activity: Not on file  Other Topics Concern   Not on file  Social History Narrative   Not on file   Social Determinants of Health   Financial Resource Strain: Not on file  Food Insecurity: Not on  file  Transportation Needs: Not on file  Physical Activity: Not on file  Stress: Not on file  Social Connections: Not on file   Outpatient Encounter Medications as of 04/30/2021  Medication Sig   aspirin 81 MG EC tablet Take 81 mg by mouth daily. Swallow whole.   atorvastatin (LIPITOR) 20 MG tablet TAKE 1 TABLET BY MOUTH EVERY DAY (Patient taking differently: Take 40 mg by mouth daily.)   B-D LUER-LOK SYRINGE 20G X 1" 1 ML MISC USE TO INJECT TESTOSTERONE EVERY WEEK   busPIRone (BUSPAR) 15 MG tablet Take 15 mg by mouth 2 (two) times daily.   clonazePAM (KLONOPIN) 0.5 MG tablet Take 0.5 mg by mouth 3 (three) times daily as needed for anxiety.   [facility] D3 125 MCG (5000 UT) capsule TAKE 1 CAPSULE BY MOUTH EVERY DAY   desmopressin (DDAVP) 0.1 MG tablet TAKE 1 TABLET (0.1 MG TOTAL) BY MOUTH IN THE MORNING, AT NOON, AND AT BEDTIME.   Dulaglutide (TRULICITY) 3 MY/1.1ZN SOPN Inject 3 mg as directed once a week.   FLUoxetine (PROZAC) 20 MG tablet Take 20 mg by mouth daily.   hydrocortisone (CORTEF) 5 MG tablet TAKE 2 TABLETS BY MOUTH TWICE A DAY   icosapent Ethyl (VASCEPA) 1 g capsule Take 2 g by mouth 2 (two) times daily.   JARDIANCE 25 MG TABS tablet Take 25 mg by  mouth daily.   levothyroxine (SYNTHROID) 112 MCG tablet TAKE 1 TABLET BY MOUTH EVERY DAY BEFORE BREAKFAST   metoprolol tartrate (LOPRESSOR) 25 MG tablet Take 25 mg by mouth 2 (two) times daily.   potassium chloride SA (K-DUR,KLOR-CON) 20 MEQ tablet Take 20 mEq by mouth daily.   SYRINGE-NEEDLE, DISP, 3 ML (B-D 3CC LUER-LOK SYR 20GX1-1/2) 20G X 1-1/2" 3 ML MISC USE TO INJECT TESTOSTERONE EVERY WEEK   testosterone cypionate (DEPOTESTOSTERONE CYPIONATE) 200 MG/ML injection INJECT 0.5 MLS INTO THE MUSCLES EVERY 7 DAYS   [DISCONTINUED] aspirin 325 MG tablet Take by mouth daily.    [DISCONTINUED] metFORMIN (GLUCOPHAGE) 500 MG tablet TAKE 1 TABLET BY MOUTH TWICE A DAY WITH MEALS   No facility-administered encounter medications on file as of  04/30/2021.   ALLERGIES: No Known Allergies VACCINATION STATUS:  There is no immunization history on file for this patient.  Diabetes He presents for his follow-up diabetic visit. He has type 2 diabetes mellitus. Onset time: He was diagnosed at approximate age of 55 years. His disease course has been improving. There are no hypoglycemic associated symptoms. Pertinent negatives for hypoglycemia include no confusion, headaches, pallor or seizures. Pertinent negatives for diabetes include no chest pain, no fatigue, no polydipsia, no polyphagia, no polyuria and no weakness. There are no hypoglycemic complications. Symptoms are improving. There are no diabetic complications. (Triple bypass and mitral valve repair.) Risk factors for coronary artery disease include diabetes mellitus, dyslipidemia, hypertension, obesity, tobacco exposure, sedentary lifestyle and male sex. Current diabetic treatment includes oral agent (monotherapy). He is compliant with treatment most of the time. His weight is decreasing steadily. He is following a generally unhealthy diet. When asked about meal planning, he reported none. He has not had a previous visit with a dietitian. He never participates in exercise. (He presents with no meter nor logs.  His point-of-care A1c is 5.9% improving from 7.9%.  He was started on Jardiance 25 mg by his PMD in the interval.  This is in addition to his Trulicity and metformin. ) An ACE inhibitor/angiotensin II receptor blocker is not being taken. He does not see a podiatrist.Eye exam is current.  Hyperlipidemia This is a chronic problem. The current episode started more than 1 year ago. The problem is uncontrolled. Recent lipid tests were reviewed and are variable. Exacerbating diseases include chronic renal disease, diabetes, hypothyroidism and obesity. Factors aggravating his hyperlipidemia include fatty foods. Pertinent negatives include no chest pain, myalgias or shortness of breath. Current  antihyperlipidemic treatment includes statins. The current treatment provides moderate improvement of lipids. There are no compliance problems.  Risk factors for coronary artery disease include diabetes mellitus, dyslipidemia, hypertension, male sex, obesity and a sedentary lifestyle.  Hypertension This is a chronic problem. The current episode started more than 1 year ago. The problem has been gradually improving since onset. The problem is controlled. Pertinent negatives include no chest pain, headaches, neck pain, palpitations or shortness of breath. Agents associated with hypertension include thyroid hormones and steroids. Risk factors for coronary artery disease include diabetes mellitus, dyslipidemia, male gender, obesity, smoking/tobacco exposure and sedentary lifestyle. Past treatments include nothing. There are no compliance problems.  Hypertensive end-organ damage includes kidney disease and CAD/MI. Identifiable causes of hypertension include chronic renal disease and a thyroid problem.   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 He is on multiple hormonal replacement therapy including levothyroxine, hydrocortisone, desmopressin, testosterone.  He has no new complaints regarding this diagnosis at  today's visit. He admits that he has not been consistent injecting his testosterone weekly.  His previsit labs show total testosterone 153, suboptimal also increasing from 46.  Review of systems  Constitutional: + Minimally fluctuating body weight,  current  Body mass index is 34.25 kg/m. , + fatigue, no subjective hyperthermia, no subjective hypothermia     Objective:    BP 110/72   Pulse 64   Ht _0  (1.803 m)   Wt 245 lb 9.6 oz (111.4 kg)   BMI 34.25 kg/m   Wt Readings from Last 3 Encounters:  04/30/21 245 lb 9.6 oz (111.4 kg)  12/24/20 252 lb 12.8 oz (114.7 kg)   06/24/20 248 lb 12.8 oz (112.9 kg)    BP Readings from Last 3 Encounters:  04/30/21 110/72  12/24/20 (!) 141/89  06/24/20 122/80    Physical Exam- Limited  Constitutional:  Body mass index is 34.25 kg/m. , not in acute distress, normal state of mind    - Summary of his pituitary/sella MRI from 08/13/2016 as follows - IMPRESSION: 1. Transsphenoidal resection of previous pituitary tumor without evidence for residual or recurrent neoplasm. 2. Residual enhancing pituitary tissue is within normal limits. 3. Slight rightward deviation of the pituitary stalk likely related to scarring and previous surgery. 4. Slight downward deviation of the optic chiasm, also likely related to prior surgery. 5. The suprasellar brain is normal for age. 6. Extensive sinus disease as described.  Recent Results (from the past 2160 hour(s))  Comprehensive metabolic panel     Status: Abnormal   Collection Time: 04/24/21  8:14 AM  Result Value Ref Range   Glucose 106 (H) 65 - 99 mg/dL   BUN 10 6 - 24 mg/dL   Creatinine, Ser 1.33 (H) 0.76 - 1.27 mg/dL   eGFR 63 >59 mL/min/1.73   BUN/Creatinine Ratio 8 (L) 9 - 20   Sodium 138 134 - 144 mmol/L   Potassium 4.5 3.5 - 5.2 mmol/L   Chloride 104 96 - 106 mmol/L   CO2 24 20 - 29 mmol/L   Calcium 9.1 8.7 - 10.2 mg/dL   Total Protein 6.6 6.0 - 8.5 g/dL   Albumin 4.3 3.8 - 4.9 g/dL   Globulin, Total 2.3 1.5 - 4.5 g/dL   Albumin/Globulin Ratio 1.9 1.2 - 2.2   Bilirubin Total 0.4 0.0 - 1.2 mg/dL   Alkaline Phosphatase 85 44 - 121 IU/L   AST 16 0 - 40 IU/L   ALT 22 0 - 44 IU/L  Testosterone, Free, Total, SHBG     Status: Abnormal   Collection Time: 04/24/21  8:14 AM  Result Value Ref Range   Testosterone 475 264 - 916 ng/dL    Comment: Adult male reference interval is based on a population of healthy nonobese males (BMI <30) between 27 and 85 years old. [place], [place] [phone]. PMID: [id number].    Testosterone, Free 5.5 (L) 7.2 - 24.0  pg/mL   Sex Hormone Binding 26.5 19.3 - 76.4 nmol/L  PSA     Status: None   Collection Time: 04/24/21  8:14 AM  Result Value Ref Range   Prostate Specific Ag, Serum 0.3 0.0 - 4.0 ng/mL    Comment: Roche ECLIA methodology. According to the American Urological Association, Serum PSA should decrease and remain at undetectable levels after radical prostatectomy. The AUA defines biochemical recurrence as an initial PSA value 0.2 ng/mL or greater followed by a subsequent confirmatory  PSA value 0.2 ng/mL or greater. Values obtained with different assay methods or kits cannot be used interchangeably. Results cannot be interpreted as absolute evidence of the presence or absence of malignant disease.   T4, free     Status: None   Collection Time: 04/24/21  8:14 AM  Result Value Ref Range   Free T4 1.63 0.82 - 1.77 ng/dL  HgB A1c     Status: None   Collection Time: 04/30/21  9:32 AM  Result Value Ref Range   Hemoglobin A1C     HbA1c POC (<> result, manual entry)     HbA1c, POC (prediabetic range)     HbA1c, POC (controlled diabetic range) 5.9 0.0 - 7.0 %   Lipid Panel     Component Value Date/Time   CHOL 172 01/19/2021 0000   CHOL 155 06/18/2019 0933   TRIG 319 (A) 01/19/2021 0000   HDL 22 (A) 01/19/2021 0000   HDL 23 (L) 06/18/2019 0933   CHOLHDL 6.5 (H) 08/12/2016 1018   LDLCALC 96 01/19/2021 0000   LDLCALC 90 06/18/2019 0933    Assessment & Plan:   1. Type 2 diabetes with vascular complications  - Patient has currently controlled asymptomatic type 2 DM since  55 years of age.    His diabetes is complicated by complicated by coronary artery disease status post triple bypass and mitral valve repair and patient remains at exceedingly high risk for more acute and chronic complications of diabetes  which include CAD, CVA, CKD, retinopathy, and neuropathy. These are all discussed in detail with the patient.  He presents with no meter nor logs.  His point-of-care A1c is 5.9%  improving from 7.9%.  He was started on Jardiance 25 mg by his PMD in the interval.  This is in addition to his Trulicity and metformin.   - he acknowledges that there is a room for improvement in his food and drink choices. - Suggestion is made for him to avoid simple carbohydrates  from his diet including Cakes, Sweet Desserts, Ice Cream, Soda (diet and regular), Sweet Tea, Candies, Chips, Cookies, Store Bought Juices, Alcohol in Excess of  1-2 drinks a day, Artificial Sweeteners,  Coffee Creamer, and "Sugar-free" Products, Lemonade. This will help patient to have more stable blood glucose profile and potentially avoid unintended weight gain.    - I encouraged the patient to switch to  unprocessed or minimally processed complex starch and increased protein intake (animal or plant source), fruits, and vegetables.   - Patient is advised to stick to a routine mealtimes to eat 3 meals  a day and avoid unnecessary snacks ( to snack only to correct hypoglycemia).  - I have approached patient with the following individualized plan to manage diabetes and patient agrees:  -Given his presentation with tightly controlled glycemia evident from 5.9% A1c, he is advised to discontinue metformin for now.  He will continue to benefit from Trulicity 3 mg subcutaneously weekly.   -He was started on Jardiance 25 mg p.o. daily by his PMD.he is advised to seek a lower dose of 10 mg to lower his risk of side effects and complications from this class of medication.   -He does not need to check glucose at home regularly at this time.   - Patient specific target  A1c;  LDL, HDL, Triglycerides, and  Waist Circumference were discussed in detail.  2) BP/HTN:  -His blood pressure is controlled to target.  He is not currently on any antihypertensive medications at  this time.  He is engaged in exercise program to manage it, will be considered for low-dose lisinopril on subsequent visits.  3) Lipids/HPL:  He reports better  consistency with his Lipitor 20 mg p.o. nightly.  His last LDL was high at 96 .   He is advised to continue Lipitor 20 mg p.o. nightly.  Side effects and precautions discussed with him.  He is advised to avoid fried foods and butter.  4)  Weight/Diet:  His Body mass index is 34.25 kg/m.- Clearly complicating the care of his diabetes.  He is a candidate for modest weight loss.   CDE Consult has been requested, exercise, and detailed carbohydrates information provided.  6) Panhypopituitarism: R/t surgery for craniopharyngioma at approximate age of 53 years at [facility] [place] [facility] [place].    He has adrenal insufficiency, diabetes insipidus, hypothyroidism, and hypogonadism as a result. He is on multiple hormonal replacements including levothyroxine 112 mcg p.o. daily before breakfast.  He is also on hydrocortisone 10 mg twice a day, desmopressin 0.1 mg 3 times daily and testosterone 100 mg IM every 7 days.      -Hypogonadism: He reports better consistency following up with his testosterone injection 6 days.  He is total testosterone is 475 increasing from 153.  He wishes to be continued on testosterone replacement therapy.  He is advised to continue testosterone 100 mg IM every 7 days.  His PSA is stable and Hematocrit is 43.2.    -Hypothyroidism: His previsit thyroid function tests were consistent with appropriate replacement.  TSH is unreliable in his care.  According to free T4 his previsit labs are on target.    He is advised to continue Levothyroxine 112 mcg po daily before breakfast.  - We discussed about the correct intake of his thyroid hormone, on empty stomach at fasting, with water, separated by at least 30 minutes from breakfast and other medications,  and separated by more than 4 hours from calcium, iron, multivitamins, acid reflux medications (PPIs). -Patient is made aware of the fact that thyroid hormone replacement is needed for life, dose to be adjusted by periodic monitoring of thyroid  function tests.  - His repeat MRI of sella/pituitary in 2017- is consistent with postsurgical changes with no new lesion. Summaries dictated,  see above.   - He is status post sinus drainage by ENT with clinical improvement. He does not have any clinical complaints of mass-effect at this time.  7) Vitamin D deficiency: His most recent vitamin D level was 36.3 on 06/18/2019.  He has since completed repletion.  Will consider rechecking vitamin D level on subsequent visits.    8) Chronic Care/Health Maintenance: -His BMI is 35.2-a candidate for modest weight loss.   Patient on Statin medications and encouraged to continue to follow up with Ophthalmology, Podiatrist at least yearly or according to recommendations, and advised to   stay away from smoking. I have recommended yearly flu vaccine and pneumonia vaccination at least every 5 years; moderate intensity exercise for up to 150 minutes weekly; and  sleep for at least 7 hours a day.  His screening ABI was negative in April 2022.  His neck study will be repeated in April 2027, or sooner if needed.   - I advised patient to maintain close follow up with Earney Mallet, MD for primary care needs.    I spent 40 minutes in the care of the patient today including review of labs from CMP, Lipids, Thyroid Function, Hematology (current and previous including abstractions  from other facilities); face-to-face time discussing  his blood glucose readings/logs, discussing hypoglycemia and hyperglycemia episodes and symptoms, medications doses, his options of short and long term treatment based on the latest standards of care / guidelines;  discussion about incorporating lifestyle medicine;  and documenting the encounter.    Please refer to Patient Instructions for Blood Glucose Monitoring and Insulin/Medications Dosing Guide"  in media tab for additional information. Please  also refer to " Patient Self Inventory" in the Media  tab for reviewed elements of  pertinent patient history.  Raymond Gurney participated in the discussions, expressed understanding, and voiced agreement with the above plans.  All questions were answered to his satisfaction. he is encouraged to contact clinic should he have any questions or concerns prior to his return visit. n visit.    Follow up plan: - Return in about 4 months (around 08/30/2021) for Fasting Labs  in AM B4 8.  Rayetta Pigg, [facility] [address] [place], [place] [address] Phone: [phone] Fax: [phone] 04/30/2021, 12:12 PM

## 2021-04-30 NOTE — Patient Instructions (Signed)

## 2021-06-29 ENCOUNTER — Other Ambulatory Visit: Payer: Self-pay | Admitting: "Endocrinology

## 2021-07-11 ENCOUNTER — Other Ambulatory Visit: Payer: Self-pay | Admitting: "Endocrinology

## 2021-07-24 ENCOUNTER — Other Ambulatory Visit: Payer: Self-pay | Admitting: "Endocrinology

## 2021-07-28 LAB — CBC AND DIFFERENTIAL
HCT: 52 (ref 41–53)
Hemoglobin: 16.5 (ref 13.5–17.5)
Platelets: 162 (ref 150–399)
WBC: 7.1

## 2021-07-28 LAB — HEPATIC FUNCTION PANEL
ALT: 27 (ref 10–40)
AST: 20 (ref 14–40)
Alkaline Phosphatase: 86 (ref 25–125)
Bilirubin, Total: 0.6

## 2021-07-28 LAB — LIPID PANEL
Cholesterol: 146 (ref 0–200)
HDL: 27 — AB (ref 35–70)
LDL Cholesterol: 76
Triglycerides: 215 — AB (ref 40–160)

## 2021-07-28 LAB — BASIC METABOLIC PANEL
BUN: 23 — AB (ref 4–21)
CO2: 28 — AB (ref 13–22)
Chloride: 108 (ref 99–108)
Creatinine: 1.5 — AB (ref 0.6–1.3)
Glucose: 127
Potassium: 4.5 (ref 3.4–5.3)
Sodium: 141 (ref 137–147)

## 2021-07-28 LAB — COMPREHENSIVE METABOLIC PANEL
Albumin: 3.5 (ref 3.5–5.0)
Calcium: 9.7 (ref 8.7–10.7)
GFR calc Af Amer: >60
GFR calc non Af Amer: 47

## 2021-07-28 LAB — HEMOGLOBIN A1C: Hemoglobin A1C: 6.7

## 2021-07-28 LAB — CBC: RBC: 5.67 — AB (ref 3.87–5.11)

## 2021-08-07 ENCOUNTER — Other Ambulatory Visit: Payer: Self-pay | Admitting: "Endocrinology

## 2021-08-07 ENCOUNTER — Other Ambulatory Visit: Payer: Self-pay | Admitting: Nurse Practitioner

## 2021-08-24 ENCOUNTER — Telehealth: Payer: Self-pay

## 2021-08-24 ENCOUNTER — Other Ambulatory Visit: Payer: Self-pay | Admitting: "Endocrinology

## 2021-08-24 DIAGNOSIS — E291 Testicular hypofunction: Secondary | ICD-10-CM

## 2021-08-24 DIAGNOSIS — E230 Hypopituitarism: Secondary | ICD-10-CM

## 2021-08-24 MED ORDER — TESTOSTERONE CYPIONATE 200 MG/ML IM SOLN: INTRAMUSCULAR | 0 refills | Status: DC

## 2021-08-24 MED ORDER — "BD LUER-LOK SYRINGE 20G X 1-1/2"" 3 ML MISC": 1 refills | Status: AC

## 2021-08-24 NOTE — Telephone Encounter (Signed)
Last OV 04/30/2021  Next OV 09/01/2021  [facility] in [place], [address] sent a fax requesting refills of Testosterone and the Needle/Syringes to be sent to them for patient.

## 2021-08-25 ENCOUNTER — Other Ambulatory Visit: Payer: Self-pay | Admitting: "Endocrinology

## 2021-08-28 LAB — COMPREHENSIVE METABOLIC PANEL
ALT: 25 IU/L (ref 0–44)
AST: 24 IU/L (ref 0–40)
Albumin/Globulin Ratio: 1.4 (ref 1.2–2.2)
Albumin: 4.1 g/dL (ref 3.8–4.9)
Alkaline Phosphatase: 106 IU/L (ref 44–121)
BUN/Creatinine Ratio: 9 (ref 9–20)
BUN: 15 mg/dL (ref 6–24)
Bilirubin Total: 0.4 mg/dL (ref 0.0–1.2)
CO2: 21 mmol/L (ref 20–29)
Calcium: 9.4 mg/dL (ref 8.7–10.2)
Chloride: 102 mmol/L (ref 96–106)
Creatinine, Ser: 1.59 mg/dL — ABNORMAL HIGH (ref 0.76–1.27)
Globulin, Total: 2.9 g/dL (ref 1.5–4.5)
Glucose: 154 mg/dL — ABNORMAL HIGH (ref 70–99)
Potassium: 5 mmol/L (ref 3.5–5.2)
Sodium: 138 mmol/L (ref 134–144)
Total Protein: 7 g/dL (ref 6.0–8.5)
eGFR: 51 mL/min/{1.73_m2} — ABNORMAL LOW (ref >59)

## 2021-08-28 LAB — TESTOSTERONE, FREE, TOTAL, SHBG
Sex Hormone Binding: 30.2 nmol/L (ref 19.3–76.4)
Testosterone: 683 ng/dL (ref 264–916)

## 2021-08-28 LAB — CBC WITH DIFFERENTIAL/PLATELET
Basophils Absolute: 0.1 10*3/uL (ref 0.0–0.2)
Basos: 1 %
EOS (ABSOLUTE): 0.1 10*3/uL (ref 0.0–0.4)
Eos: 2 %
Hematocrit: 51 % (ref 37.5–51.0)
Hemoglobin: 17.4 g/dL (ref 13.0–17.7)
Immature Grans (Abs): 0 10*3/uL (ref 0.0–0.1)
Immature Granulocytes: 0 %
Lymphocytes Absolute: 2.9 10*3/uL (ref 0.7–3.1)
Lymphs: 42 %
MCH: 29.3 pg (ref 26.6–33.0)
MCHC: 34.1 g/dL (ref 31.5–35.7)
MCV: 86 fL (ref 79–97)
Monocytes Absolute: 0.6 10*3/uL (ref 0.1–0.9)
Monocytes: 8 %
Neutrophils Absolute: 3.2 10*3/uL (ref 1.4–7.0)
Neutrophils: 47 %
Platelets: 184 10*3/uL (ref 150–450)
RBC: 5.93 x10E6/uL — ABNORMAL HIGH (ref 4.14–5.80)
RDW: 12.8 % (ref 11.6–15.4)
WBC: 6.8 10*3/uL (ref 3.4–10.8)

## 2021-08-28 LAB — VITAMIN D 25 HYDROXY (VIT D DEFICIENCY, FRACTURES): Vit D, 25-Hydroxy: 24 ng/mL — ABNORMAL LOW (ref 30.0–100.0)

## 2021-08-28 LAB — T4, FREE: Free T4: 1.26 ng/dL (ref 0.82–1.77)

## 2021-09-01 ENCOUNTER — Ambulatory Visit: Payer: 59 | Admitting: "Endocrinology

## 2021-09-06 ENCOUNTER — Other Ambulatory Visit: Payer: Self-pay | Admitting: Nurse Practitioner

## 2021-09-06 ENCOUNTER — Other Ambulatory Visit: Payer: Self-pay | Admitting: "Endocrinology

## 2021-09-30 ENCOUNTER — Ambulatory Visit: Payer: 59 | Admitting: "Endocrinology

## 2021-10-01 ENCOUNTER — Other Ambulatory Visit: Payer: Self-pay | Admitting: "Endocrinology

## 2021-10-01 DIAGNOSIS — E291 Testicular hypofunction: Secondary | ICD-10-CM

## 2021-10-01 DIAGNOSIS — E230 Hypopituitarism: Secondary | ICD-10-CM

## 2021-10-01 NOTE — Telephone Encounter (Signed)
Controlled Substance  Last OV 04/30/2021  Next OV 10/06/2021  Last filled 08/24/2021 10 mL with 0 refills

## 2021-10-06 ENCOUNTER — Ambulatory Visit: Payer: 59 | Admitting: "Endocrinology

## 2021-10-12 ENCOUNTER — Ambulatory Visit (INDEPENDENT_AMBULATORY_CARE_PROVIDER_SITE_OTHER): Payer: Medicare Other | Admitting: "Endocrinology

## 2021-10-12 ENCOUNTER — Encounter: Payer: Self-pay | Admitting: "Endocrinology

## 2021-10-12 ENCOUNTER — Other Ambulatory Visit: Payer: Self-pay

## 2021-10-12 VITALS — BP 102/74 | HR 64 | Ht 71.0 in | Wt 245.0 lb

## 2021-10-12 DIAGNOSIS — E1159 Type 2 diabetes mellitus with other circulatory complications: Secondary | ICD-10-CM | POA: Diagnosis not present

## 2021-10-12 DIAGNOSIS — E291 Testicular hypofunction: Secondary | ICD-10-CM | POA: Diagnosis not present

## 2021-10-12 DIAGNOSIS — E232 Diabetes insipidus: Secondary | ICD-10-CM

## 2021-10-12 DIAGNOSIS — E559 Vitamin D deficiency, unspecified: Secondary | ICD-10-CM

## 2021-10-12 DIAGNOSIS — E230 Hypopituitarism: Secondary | ICD-10-CM

## 2021-10-12 NOTE — Patient Instructions (Signed)

## 2021-10-12 NOTE — Progress Notes (Signed)
10/12/2021   Endocrinology follow-up note    Subjective:    Patient ID: Ernest Graham, male    DOB: Apr 13, 1966.   He is returning for follow-up for multiple endocrine conditions as follows.    -He is on regular follow-up in this clinic for  multiple medical problems including type 2 diabetes, panhypopituitarism: Hypothyroidism, diabetes insipidus, hypogonadism .  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  He is on multiple hormone replacements including levothyroxine, hydrocortisone, desmopressin, testosterone.     He is  being treated for type 2 diabetes with metformin and Trulicity.  He has achieved reasonable control of type 2 diabetes with A1c progressively improving to 6.7%.   He has slightly fluctuating body weight.  He admits that he has room to improve his dietary habits.    He denies heat or cold intolerance. He denies polyuria, nocturia, nor recent hospitalizations.  He has no new complaints today.   Past Medical History:  Diagnosis Date   Adrenal disease ([place])    Diabetes mellitus, type II ([place])    Heart attack ([place])    Hyperlipidemia    Past Surgical History:  Procedure Laterality Date   CORONARY ARTERY BYPASS GRAFT     MITRAL VALVE REPAIR     Removal of Cricopharyngeoma     Social History   Socioeconomic History   Marital status: Married    Spouse name: Not on file   Number of children: Not on file   Years of education: Not on file   Highest education level: Not on file  Occupational History   Not on file  Tobacco Use   Smoking status: Former   Smokeless tobacco: Never  Vaping Use   Vaping Use: Never used  Substance and Sexual Activity   Alcohol use: No    Alcohol/week: 0.0 standard drinks   Drug use: No   Sexual activity: Not on file  Other Topics Concern   Not on file  Social History Narrative   Not on file   Social Determinants of Health   Financial Resource Strain: Not on  file  Food Insecurity: Not on file  Transportation Needs: Not on file  Physical Activity: Not on file  Stress: Not on file  Social Connections: Not on file   Outpatient Encounter Medications as of 10/12/2021  Medication Sig   aspirin 81 MG EC tablet Take 81 mg by mouth daily. Swallow whole.   atorvastatin (LIPITOR) 20 MG tablet TAKE 1 TABLET BY MOUTH EVERY DAY (Patient taking differently: Take 40 mg by mouth daily.)   B-D LUER-LOK SYRINGE 20G X 1" 1 ML MISC USE TO INJECT TESTOSTERONE EVERY WEEK   busPIRone (BUSPAR) 15 MG tablet Take 15 mg by mouth 2 (two) times daily.   clonazePAM (KLONOPIN) 0.5 MG tablet Take 0.5 mg by mouth 3 (three) times daily as needed for anxiety.   [employer] D3 125 MCG (5000 UT) capsule TAKE 1 CAPSULE BY MOUTH EVERY DAY   desmopressin (DDAVP) 0.1 MG tablet TAKE 1 TABLET (0.1 MG TOTAL) BY MOUTH IN THE MORNING, AT NOON, AND AT BEDTIME.   FLUoxetine (PROZAC) 20 MG tablet Take 20 mg by mouth daily.   hydrocortisone (CORTEF) 5 MG tablet TAKE 2 TABLETS BY MOUTH TWICE A DAY   icosapent Ethyl (VASCEPA) 1 g capsule Take 2 g by mouth 2 (two) times daily.   JARDIANCE 25 MG TABS tablet Take 25 mg by mouth daily.   levothyroxine (SYNTHROID) 112 MCG tablet  TAKE 1 TABLET BY MOUTH EVERY DAY BEFORE BREAKFAST   metFORMIN (GLUCOPHAGE) 500 MG tablet TAKE 1 TABLET BY MOUTH TWICE A DAY WITH MEALS   metoprolol tartrate (LOPRESSOR) 25 MG tablet Take 25 mg by mouth 2 (two) times daily.   potassium chloride SA (K-DUR,KLOR-CON) 20 MEQ tablet Take 20 mEq by mouth daily.   SYRINGE-NEEDLE, DISP, 3 ML (B-D 3CC LUER-LOK SYR 20GX1-1/2) 20G X 1-1/2" 3 ML MISC USE TO INJECT TESTOSTERONE EVERY WEEK   testosterone cypionate (DEPOTESTOSTERONE CYPIONATE) 200 MG/ML injection INJECT 0.5 MLS INTO THE MUSCLE EVERY 7 DAYS   TRULICITY 3 YW/7.3XT SOPN INJECT 3 MG AS DIRECTED ONCE A WEEK.   No facility-administered encounter medications on file as of 10/12/2021.   ALLERGIES: No Known Allergies VACCINATION  STATUS:  There is no immunization history on file for this patient.  Diabetes He presents for his follow-up diabetic visit. He has type 2 diabetes mellitus. Onset time: He was diagnosed at approximate age of 56 years. His disease course has been stable. There are no hypoglycemic associated symptoms. Pertinent negatives for hypoglycemia include no confusion, headaches, pallor or seizures. Pertinent negatives for diabetes include no chest pain, no fatigue, no polydipsia, no polyphagia, no polyuria and no weakness. There are no hypoglycemic complications. Symptoms are stable. There are no diabetic complications. (Triple bypass and mitral valve repair.) Risk factors for coronary artery disease include diabetes mellitus, dyslipidemia, hypertension, obesity, tobacco exposure, sedentary lifestyle and male sex. Current diabetic treatment includes oral agent (monotherapy). He is compliant with treatment most of the time. His weight is decreasing steadily. He is following a generally unhealthy diet. When asked about meal planning, he reported none. He has not had a previous visit with a dietitian. He never participates in exercise. (He presents with no meter nor logs.  His previsit labs show A1c of 6.7%, overall improving.  He denies hypoglycemia.  He remains on Jardiance 25 mg p.o. daily as well as Trulicity 3 mg subcutaneously weekly.  ) An ACE inhibitor/angiotensin II receptor blocker is not being taken. He does not see a podiatrist.Eye exam is current.  Hyperlipidemia This is a chronic problem. The current episode started more than 1 year ago. The problem is uncontrolled. Recent lipid tests were reviewed and are variable. Exacerbating diseases include chronic renal disease, diabetes, hypothyroidism and obesity. Factors aggravating his hyperlipidemia include fatty foods. Pertinent negatives include no chest pain, myalgias or shortness of breath. Current antihyperlipidemic treatment includes statins. The current  treatment provides moderate improvement of lipids. There are no compliance problems.  Risk factors for coronary artery disease include diabetes mellitus, dyslipidemia, hypertension, male sex, obesity and a sedentary lifestyle.  Hypertension This is a chronic problem. The current episode started more than 1 year ago. The problem has been gradually improving since onset. The problem is controlled. Pertinent negatives include no chest pain, headaches, neck pain, palpitations or shortness of breath. Agents associated with hypertension include thyroid hormones and steroids. Risk factors for coronary artery disease include diabetes mellitus, dyslipidemia, male gender, obesity, smoking/tobacco exposure and sedentary lifestyle. Past treatments include nothing. There are no compliance problems.  Hypertensive end-organ damage includes kidney disease and CAD/MI. Identifiable causes of hypertension include chronic renal disease and a thyroid problem.   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 He is on multiple hormonal replacement therapy including levothyroxine, hydrocortisone, desmopressin, testosterone.  He has no new complaints regarding this diagnosis at  today's visit. He reports that he is consistent taking his testosterone, returns with previsit labs showing total testosterone of 683.    Review of systems  Constitutional: + Minimally fluctuating body weight,  current  Body mass index is 34.17 kg/m. , + fatigue, no subjective hyperthermia, no subjective hypothermia     Objective:    BP 102/74    Pulse 64    Ht _0  (1.803 m)    Wt 245 lb (111.1 kg)    BMI 34.17 kg/m   Wt Readings from Last 3 Encounters:  10/12/21 245 lb (111.1 kg)  04/30/21 245 lb 9.6 oz (111.4 kg)  12/24/20 252 lb 12.8 oz (114.7 kg)    BP Readings from Last 3 Encounters:  10/12/21 102/74  04/30/21 110/72   12/24/20 (!) 141/89    Physical Exam- Limited  Constitutional:  Body mass index is 34.17 kg/m. , not in acute distress, normal state of mind    - Summary of his pituitary/sella MRI from 08/13/2016 as follows - IMPRESSION: 1. Transsphenoidal resection of previous pituitary tumor without evidence for residual or recurrent neoplasm. 2. Residual enhancing pituitary tissue is within normal limits. 3. Slight rightward deviation of the pituitary stalk likely related to scarring and previous surgery. 4. Slight downward deviation of the optic chiasm, also likely related to prior surgery. 5. The suprasellar brain is normal for age. 6. Extensive sinus disease as described.  Recent Results (from the past 2160 hour(s))  CBC and differential     Status: None   Collection Time: 07/28/21 12:00 AM  Result Value Ref Range   Hemoglobin 16.5 13.5 - 17.5   HCT 52 41 - 53   Platelets 162 150 - 399   WBC 7.1   CBC     Status: Abnormal   Collection Time: 07/28/21 12:00 AM  Result Value Ref Range   RBC 5.67 (A) 3.87 - 8.10  Basic metabolic panel     Status: Abnormal   Collection Time: 07/28/21 12:00 AM  Result Value Ref Range   Glucose 127    BUN 23 (A) 4 - 21   CO2 28 (A) 13 - 22   Creatinine 1.5 (A) 0.6 - 1.3   Potassium 4.5 3.4 - 5.3   Sodium 141 137 - 147   Chloride 108 99 - 108  Comprehensive metabolic panel     Status: None   Collection Time: 07/28/21 12:00 AM  Result Value Ref Range   GFR calc Af Amer >60    GFR calc non Af Amer 47    Calcium 9.7 8.7 - 10.7   Albumin 3.5 3.5 - 5.0  Lipid panel     Status: Abnormal   Collection Time: 07/28/21 12:00 AM  Result Value Ref Range   Triglycerides 215 (A) 40 - 160   Cholesterol 146 0 - 200   HDL 27 (A) 35 - 70   LDL Cholesterol 76   Hepatic function panel     Status: None   Collection Time: 07/28/21 12:00 AM  Result Value Ref Range   Alkaline Phosphatase 86 25 - 125   ALT 27 10 - 40   AST 20 14 - 40   Bilirubin, Total 0.6    Hemoglobin A1c     Status: None   Collection Time: 07/28/21 12:00 AM  Result Value Ref Range   Hemoglobin A1C 6.7   Comprehensive metabolic panel     Status: Abnormal   Collection Time: 08/26/21  8:33 AM  Result  Value Ref Range   Glucose 154 (H) 70 - 99 mg/dL   BUN 15 6 - 24 mg/dL   Creatinine, Ser 1.59 (H) 0.76 - 1.27 mg/dL   eGFR 51 (L) >59 mL/min/1.73   BUN/Creatinine Ratio 9 9 - 20   Sodium 138 134 - 144 mmol/L   Potassium 5.0 3.5 - 5.2 mmol/L   Chloride 102 96 - 106 mmol/L   CO2 21 20 - 29 mmol/L   Calcium 9.4 8.7 - 10.2 mg/dL   Total Protein 7.0 6.0 - 8.5 g/dL   Albumin 4.1 3.8 - 4.9 g/dL   Globulin, Total 2.9 1.5 - 4.5 g/dL   Albumin/Globulin Ratio 1.4 1.2 - 2.2   Bilirubin Total 0.4 0.0 - 1.2 mg/dL   Alkaline Phosphatase 106 44 - 121 IU/L   AST 24 0 - 40 IU/L   ALT 25 0 - 44 IU/L  T4, free     Status: None   Collection Time: 08/26/21  8:33 AM  Result Value Ref Range   Free T4 1.26 0.82 - 1.77 ng/dL  VITAMIN D 25 Hydroxy (Vit-D Deficiency, Fractures)     Status: Abnormal   Collection Time: 08/26/21  8:33 AM  Result Value Ref Range   Vit D, 25-Hydroxy 24.0 (L) 30.0 - 100.0 ng/mL    Comment: Vitamin D deficiency has been defined by the Institute of Medicine and an Endocrine Society practice guideline as a level of serum 25-OH vitamin D less than 20 ng/mL (1,2). The Endocrine Society went on to further define vitamin D insufficiency as a level between 21 and 29 ng/mL (2). 1. IOM (Institute of Medicine). 2010. Dietary reference    intakes for calcium and D. [place]: The    [employer]. 2. Holick MF, Binkley Chandler, Bischoff-Ferrari HA, et al.    Evaluation, treatment, and prevention of vitamin D    deficiency: an Endocrine Society clinical practice    guideline. JCEM. 2011 Jul; 96(7):1911-30.   CBC with Differential/Platelet     Status: Abnormal   Collection Time: 08/26/21  8:33 AM  Result Value Ref Range   WBC 6.8 3.4 - 10.8 x10E3/uL   RBC 5.93  (H) 4.14 - 5.80 x10E6/uL   Hemoglobin 17.4 13.0 - 17.7 g/dL   Hematocrit 51.0 37.5 - 51.0 %   MCV 86 79 - 97 fL   MCH 29.3 26.6 - 33.0 pg   MCHC 34.1 31.5 - 35.7 g/dL   RDW 12.8 11.6 - 15.4 %   Platelets 184 150 - 450 x10E3/uL   Neutrophils 47 Not Estab. %   Lymphs 42 Not Estab. %   Monocytes 8 Not Estab. %   Eos 2 Not Estab. %   Basos 1 Not Estab. %   Neutrophils Absolute 3.2 1.4 - 7.0 x10E3/uL   Lymphocytes Absolute 2.9 0.7 - 3.1 x10E3/uL   Monocytes Absolute 0.6 0.1 - 0.9 x10E3/uL   EOS (ABSOLUTE) 0.1 0.0 - 0.4 x10E3/uL   Basophils Absolute 0.1 0.0 - 0.2 x10E3/uL   Immature Granulocytes 0 Not Estab. %   Immature Grans (Abs) 0.0 0.0 - 0.1 x10E3/uL  Testosterone, Free, Total, SHBG     Status: None   Collection Time: 08/26/21  8:33 AM  Result Value Ref Range   Testosterone 683 264 - 916 ng/dL    Comment: Adult male reference interval is based on a population of healthy nonobese males (BMI <30) between 52 and 7 years old. [place], [place] [phone]. PMID: [id number].    Testosterone,  Free CANCELED pg/mL    Comment: Test not performed. Specimen is grossly lipemic.  Result canceled by the ancillary.    Sex Hormone Binding 30.2 19.3 - 76.4 nmol/L   Lipid Panel     Component Value Date/Time   CHOL 146 07/28/2021 0000   CHOL 155 06/18/2019 0933   TRIG 215 (A) 07/28/2021 0000   HDL 27 (A) 07/28/2021 0000   HDL 23 (L) 06/18/2019 0933   CHOLHDL 6.5 (H) 08/12/2016 1018   LDLCALC 76 07/28/2021 0000   LDLCALC 90 06/18/2019 0933    Assessment & Plan:   1. Type 2 diabetes with vascular complications  - Patient has currently controlled asymptomatic type 2 DM since  56 years of age.    His diabetes is complicated by complicated by coronary artery disease status post triple bypass and mitral valve repair and patient remains at exceedingly high risk for more acute and chronic complications of diabetes  which include CAD, CVA, CKD, retinopathy, and neuropathy. These  are all discussed in detail with the patient.  He presents with no meter nor logs.  His previsit labs show A1c of 6.7%, generally improving.    -  He acknowledges that there is a room for improvement in his food and drink choices. - Suggestion is made for him to avoid simple carbohydrates  from his diet including Cakes, Sweet Desserts, Ice Cream, Soda (diet and regular), Sweet Tea, Candies, Chips, Cookies, Store Bought Juices, Alcohol in Excess of  1-2 drinks a day, Artificial Sweeteners,  Coffee Creamer, and "Sugar-free" Products, Lemonade. This will help patient to have more stable blood glucose profile and potentially avoid unintended weight gain.      - Patient is advised to stick to a routine mealtimes to eat 3 meals  a day and avoid unnecessary snacks ( to snack only to correct hypoglycemia).  - I have approached patient with the following individualized plan to manage diabetes and patient agrees:  -Given his presentation with target A1c of 6.7%, he is advised to continue Trulicity 3 mg subcutaneously weekly along with Jardiance 25 mg p.o. daily prescribed by his PMD.  I suggest his next dose will be lowered to 10 mg due to risk of side effects and complications from this medication.  - Patient specific target  A1c;  LDL, HDL, Triglycerides, and  Waist Circumference were discussed in detail.  2) BP/HTN:   -His blood pressure is controlled to target.  He is not currently on any antihypertensive medications at this time.  He is engaged in exercise program to manage it, will be considered for low-dose lisinopril on subsequent visits.  3) Lipids/HPL:  He reports better consistency with his Lipitor 20 mg p.o. nightly.  His last LDL was high at 96 .   He is advised to continue Lipitor 20 mg p.o. nightly.      Side effects and precautions discussed with him.  He is advised to avoid fried foods and butter.  4)  Weight/Diet:  His Body mass index is 34.17 kg/m.- Clearly complicating the care  of his diabetes.  He is a candidate for modest weight loss.   CDE Consult has been requested, exercise, and detailed carbohydrates information provided.  6) Panhypopituitarism: R/t surgery for craniopharyngioma at approximate age of 29 years at [facility] [place].    He has adrenal insufficiency, diabetes insipidus, hypothyroidism, and hypogonadism as a result. He is on multiple hormonal replacements including levothyroxine 112 mcg p.o. daily before breakfast.  He is also  on hydrocortisone 10 mg p.o. twice daily, desmopressin 0.1 mg 3 times daily and testosterone 100 mg IM every 7 days.      -Hypogonadism: He reports better consistency with his testosterone injection.  His total testosterone is 683 ng per DL.    He is advised to continue testosterone 100 mg IM every 7 days.  His PSA is stable and Hematocrit is 43.2.    -Hypothyroidism: His previsit thyroid function tests were consistent with appropriate replacement.  TSH is unreliable in his care.  According to free T4 his previsit labs are on target.    He is advised to continue Levothyroxine 112 mcg po daily before breakfast.  - We discussed about the correct intake of his thyroid hormone, on empty stomach at fasting, with water, separated by at least 30 minutes from breakfast and other medications,  and separated by more than 4 hours from calcium, iron, multivitamins, acid reflux medications (PPIs). -Patient is made aware of the fact that thyroid hormone replacement is needed for life, dose to be adjusted by periodic monitoring of thyroid function tests.   - His repeat MRI of sella/pituitary in 2017- is consistent with postsurgical changes with no new lesion. Summaries dictated,  see above.   - He is status post sinus drainage by ENT with clinical improvement. He does not have any clinical complaints of mass-effect at this time.  7) Vitamin D deficiency: His most recent vitamin D level was 36.3 on 06/18/2019.  He has since completed  repletion.  Will consider rechecking vitamin D level on subsequent visits.    8) Chronic Care/Health Maintenance: -His BMI is 35.2-a candidate for modest weight loss.   Patient on Statin medications and encouraged to continue to follow up with Ophthalmology, Podiatrist at least yearly or according to recommendations, and advised to   stay away from smoking. I have recommended yearly flu vaccine and pneumonia vaccination at least every 5 years; moderate intensity exercise for up to 150 minutes weekly; and  sleep for at least 7 hours a day.  His screening ABI was negative in April 2022.  His neck study will be repeated in April 2027, or sooner if needed.   - I advised patient to maintain close follow up with Earney Mallet, MD for primary care needs.   I spent 35 minutes in the care of the patient today including review of labs from Thyroid Function, CMP, and other relevant labs ; imaging/biopsy records (current and previous including abstractions from other facilities); face-to-face time discussing  his lab results and symptoms, medications doses, his options of short and long term treatment based on the latest standards of care / guidelines;   and documenting the encounter.  Raymond Gurney  participated in the discussions, expressed understanding, and voiced agreement with the above plans.  All questions were answered to his satisfaction. he is encouraged to contact clinic should he have any questions or concerns prior to his return visit.    Follow up plan: - Return in about 4 months (around 02/09/2022) for Fasting Labs  in AM B4 8, A1c -NV.  Rayetta Pigg, [facility] [address] [place], [place] [address] Phone: [phone] Fax: [phone] 10/12/2021, 2:16 PM

## 2021-11-15 ENCOUNTER — Other Ambulatory Visit: Payer: Self-pay | Admitting: "Endocrinology

## 2021-12-02 ENCOUNTER — Other Ambulatory Visit: Payer: Self-pay | Admitting: "Endocrinology

## 2021-12-08 ENCOUNTER — Other Ambulatory Visit: Payer: Self-pay | Admitting: "Endocrinology

## 2021-12-08 NOTE — Telephone Encounter (Signed)
Last OV 10/12/2021 ? ?Is the patient still taking Metformin? I did not see this mentioned in his last OV notes. ?

## 2022-01-17 ENCOUNTER — Other Ambulatory Visit: Payer: Self-pay | Admitting: "Endocrinology

## 2022-02-05 LAB — TESTOSTERONE, FREE, TOTAL, SHBG
Sex Hormone Binding: 26.7 nmol/L (ref 19.3–76.4)
Testosterone, Free: 9.5 pg/mL (ref 7.2–24.0)
Testosterone: 628 ng/dL (ref 264–916)

## 2022-02-05 LAB — CBC WITH DIFFERENTIAL/PLATELET
Basophils Absolute: 0.1 10*3/uL (ref 0.0–0.2)
Basos: 1 %
EOS (ABSOLUTE): 0.2 10*3/uL (ref 0.0–0.4)
Eos: 2 %
Hematocrit: 48.3 % (ref 37.5–51.0)
Hemoglobin: 16.5 g/dL (ref 13.0–17.7)
Immature Grans (Abs): 0 10*3/uL (ref 0.0–0.1)
Immature Granulocytes: 0 %
Lymphocytes Absolute: 3.1 10*3/uL (ref 0.7–3.1)
Lymphs: 44 %
MCH: 29.8 pg (ref 26.6–33.0)
MCHC: 34.2 g/dL (ref 31.5–35.7)
MCV: 87 fL (ref 79–97)
Monocytes Absolute: 0.8 10*3/uL (ref 0.1–0.9)
Monocytes: 11 %
Neutrophils Absolute: 3 10*3/uL (ref 1.4–7.0)
Neutrophils: 42 %
Platelets: 148 10*3/uL — ABNORMAL LOW (ref 150–450)
RBC: 5.54 x10E6/uL (ref 4.14–5.80)
RDW: 12.7 % (ref 11.6–15.4)
WBC: 7.2 10*3/uL (ref 3.4–10.8)

## 2022-02-10 ENCOUNTER — Encounter: Payer: Self-pay | Admitting: "Endocrinology

## 2022-02-10 ENCOUNTER — Ambulatory Visit (INDEPENDENT_AMBULATORY_CARE_PROVIDER_SITE_OTHER): Payer: 59 | Admitting: "Endocrinology

## 2022-02-10 VITALS — BP 98/62 | HR 68 | Ht 71.0 in | Wt 251.6 lb

## 2022-02-10 DIAGNOSIS — E890 Postprocedural hypothyroidism: Secondary | ICD-10-CM

## 2022-02-10 DIAGNOSIS — E1159 Type 2 diabetes mellitus with other circulatory complications: Secondary | ICD-10-CM | POA: Diagnosis not present

## 2022-02-10 DIAGNOSIS — E291 Testicular hypofunction: Secondary | ICD-10-CM | POA: Diagnosis not present

## 2022-02-10 DIAGNOSIS — E230 Hypopituitarism: Secondary | ICD-10-CM | POA: Diagnosis not present

## 2022-02-10 DIAGNOSIS — E232 Diabetes insipidus: Secondary | ICD-10-CM | POA: Diagnosis not present

## 2022-02-10 DIAGNOSIS — E559 Vitamin D deficiency, unspecified: Secondary | ICD-10-CM

## 2022-02-10 MED ORDER — TRULICITY 3 MG/0.5ML ~~LOC~~ SOAJ
3.0000 mg | SUBCUTANEOUS | 1 refills | Status: DC
Start: 2022-02-10 — End: 2022-06-15

## 2022-02-10 NOTE — Patient Instructions (Signed)

## 2022-02-10 NOTE — Progress Notes (Signed)
02/10/2022   Endocrinology follow-up note    Subjective:    Patient ID: Ernest Graham, male    DOB: 01/26/1966.   He is returning for follow-up for multiple endocrine conditions as follows.    -He is on regular follow-up in this clinic for  multiple medical problems including type 2 diabetes, panhypopituitarism: Hypothyroidism, diabetes insipidus, hypogonadism .  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  He is on multiple hormone replacements including levothyroxine, hydrocortisone, desmopressin, testosterone.     He is  being treated for type 2 diabetes with metformin and Trulicity.  He has achieved reasonable control of type 2 diabetes with A1c progressively improving to 6.7%.   He has slightly fluctuating body weight.  He admits that he has room to improve his dietary habits.    He denies heat or cold intolerance. He denies polyuria, nocturia, nor recent hospitalizations.  He has no new complaints today.   Past Medical History:  Diagnosis Date   Adrenal disease (HCC)    Diabetes mellitus, type II (HCC)    Heart attack (HCC)    Hyperlipidemia    Past Surgical History:  Procedure Laterality Date   CORONARY ARTERY BYPASS GRAFT     MITRAL VALVE REPAIR     Removal of Cricopharyngeoma     Social History   Socioeconomic History   Marital status: Married    Spouse name: Not on file   Number of children: Not on file   Years of education: Not on file   Highest education level: Not on file  Occupational History   Not on file  Tobacco Use   Smoking status: Former   Smokeless tobacco: Never  Vaping Use   Vaping Use: Never used  Substance and Sexual Activity   Alcohol use: No    Alcohol/week: 0.0 standard drinks   Drug use: No   Sexual activity: Not on file  Other Topics Concern   Not on file  Social History Narrative   Not on file   Social Determinants of Health   Financial Resource Strain: Not on  file  Food Insecurity: Not on file  Transportation Needs: Not on file  Physical Activity: Not on file  Stress: Not on file  Social Connections: Not on file   Outpatient Encounter Medications as of 02/10/2022  Medication Sig   aspirin 81 MG EC tablet Take 81 mg by mouth daily. Swallow whole.   atorvastatin (LIPITOR) 20 MG tablet TAKE 1 TABLET BY MOUTH EVERY DAY (Patient taking differently: Take 40 mg by mouth daily.)   B-D LUER-LOK SYRINGE 20G X 1" 1 ML MISC USE TO INJECT TESTOSTERONE EVERY WEEK   busPIRone (BUSPAR) 15 MG tablet Take 15 mg by mouth 2 (two) times daily.   clonazePAM (KLONOPIN) 0.5 MG tablet Take 0.5 mg by mouth 3 (three) times daily as needed for anxiety.   [employer] D3 125 MCG (5000 UT) capsule TAKE 1 CAPSULE BY MOUTH EVERY DAY   desmopressin (DDAVP) 0.1 MG tablet TAKE 1 TABLET (0.1 MG TOTAL) BY MOUTH IN THE MORNING, AT NOON, AND AT BEDTIME.   Dulaglutide (TRULICITY) 3 MG/0.5ML SOPN Inject 3 mg as directed once a week.   FLUoxetine (PROZAC) 20 MG tablet Take 20 mg by mouth daily.   hydrocortisone (CORTEF) 5 MG tablet TAKE 2 TABLETS BY MOUTH TWICE A DAY   icosapent Ethyl (VASCEPA) 1 g capsule Take 2 g by mouth 2 (two) times daily.   JARDIANCE 25 MG  TABS tablet Take 25 mg by mouth daily.   levothyroxine (SYNTHROID) 112 MCG tablet TAKE 1 TABLET BY MOUTH EVERY DAY BEFORE BREAKFAST   metFORMIN (GLUCOPHAGE) 500 MG tablet TAKE 1 TABLET BY MOUTH TWICE A DAY WITH MEALS   metoprolol tartrate (LOPRESSOR) 25 MG tablet Take 25 mg by mouth 2 (two) times daily.   potassium chloride SA (K-DUR,KLOR-CON) 20 MEQ tablet Take 20 mEq by mouth daily.   SYRINGE-NEEDLE, DISP, 3 ML (B-D 3CC LUER-LOK SYR 20GX1-1/2) 20G X 1-1/2" 3 ML MISC USE TO INJECT TESTOSTERONE EVERY WEEK   testosterone cypionate (DEPOTESTOSTERONE CYPIONATE) 200 MG/ML injection INJECT 0.5 MLS INTO THE MUSCLE EVERY 7 DAYS   [DISCONTINUED] TRULICITY 3 MG/0.5ML SOPN INJECT 3 MG AS DIRECTED ONCE A WEEK.   No facility-administered encounter  medications on file as of 02/10/2022.   ALLERGIES: No Known Allergies VACCINATION STATUS:  There is no immunization history on file for this patient.  Diabetes He presents for his follow-up diabetic visit. He has type 2 diabetes mellitus. Onset time: He was diagnosed at approximate age of 56 years. His disease course has been worsening. There are no hypoglycemic associated symptoms. Pertinent negatives for hypoglycemia include no confusion, headaches, pallor or seizures. Pertinent negatives for diabetes include no chest pain, no fatigue, no polydipsia, no polyphagia, no polyuria and no weakness. There are no hypoglycemic complications. Symptoms are worsening. There are no diabetic complications. (Triple bypass and mitral valve repair.) Risk factors for coronary artery disease include diabetes mellitus, dyslipidemia, hypertension, obesity, tobacco exposure, sedentary lifestyle and male sex. Current diabetic treatment includes oral agent (monotherapy). He is compliant with treatment most of the time. His weight is increasing steadily. He is following a generally unhealthy diet. When asked about meal planning, he reported none. He has not had a previous visit with a dietitian. He never participates in exercise. (He presents with no meter nor logs.  His point-of-care A1c is 8.2%, increasing from 6.7% during his last visit.   He denies hypoglycemia.  He remains on Jardiance 25 mg p.o. daily as well as Trulicity 3 mg subcutaneously weekly, as well as metformin 500 mg p.o. twice daily. ) An ACE inhibitor/angiotensin II receptor blocker is not being taken. He does not see a podiatrist.Eye exam is current.  Hyperlipidemia This is a chronic problem. The current episode started more than 1 year ago. The problem is uncontrolled. Recent lipid tests were reviewed and are variable. Exacerbating diseases include chronic renal disease, diabetes, hypothyroidism and obesity. Factors aggravating his hyperlipidemia include  fatty foods. Pertinent negatives include no chest pain, myalgias or shortness of breath. Current antihyperlipidemic treatment includes statins. The current treatment provides moderate improvement of lipids. There are no compliance problems.  Risk factors for coronary artery disease include diabetes mellitus, dyslipidemia, hypertension, male sex, obesity and a sedentary lifestyle.  Hypertension This is a chronic problem. The current episode started more than 1 year ago. The problem has been gradually improving since onset. The problem is controlled. Pertinent negatives include no chest pain, headaches, neck pain, palpitations or shortness of breath. Agents associated with hypertension include thyroid hormones and steroids. Risk factors for coronary artery disease include diabetes mellitus, dyslipidemia, male gender, obesity, smoking/tobacco exposure and sedentary lifestyle. Past treatments include nothing. There are no compliance problems.  Hypertensive end-organ damage includes kidney disease and CAD/MI. Identifiable causes of hypertension include chronic renal disease and a thyroid problem.   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 He is on multiple hormonal replacement therapy including levothyroxine, hydrocortisone, desmopressin, testosterone.  He has no new complaints regarding this diagnosis at  today's visit. He reports that he is consistent taking his testosterone, returns with previsit labs showing total testosterone of 628.      Review of systems  Constitutional: + Minimally fluctuating body weight,  current  Body mass index is 35.09 kg/m.  no subjective hyperthermia, no subjective hypothermia     Objective:    BP 98/62   Pulse 68   Ht 5\' 11"  (1.803 m)   Wt 251 lb 9.6 oz (114.1 kg)   BMI 35.09 kg/m   Wt Readings from Last 3 Encounters:  02/10/22 251 lb 9.6 oz (114.1  kg)  10/12/21 245 lb (111.1 kg)  04/30/21 245 lb 9.6 oz (111.4 kg)    BP Readings from Last 3 Encounters:  02/10/22 98/62  10/12/21 102/74  04/30/21 110/72    Physical Exam- Limited  Constitutional:  Body mass index is 35.09 kg/m. , not in acute distress, normal state of mind    - Summary of his pituitary/sella MRI from 08/13/2016 as follows - IMPRESSION: 1. Transsphenoidal resection of previous pituitary tumor without evidence for residual or recurrent neoplasm. 2. Residual enhancing pituitary tissue is within normal limits. 3. Slight rightward deviation of the pituitary stalk likely related to scarring and previous surgery. 4. Slight downward deviation of the optic chiasm, also likely related to prior surgery. 5. The suprasellar brain is normal for age. 6. Extensive sinus disease as described.  Recent Results (from the past 2160 hour(s))  Testosterone, Free, Total, SHBG     Status: None   Collection Time: 02/03/22  8:09 AM  Result Value Ref Range   Testosterone 628 264 - 916 ng/dL    Comment: Adult male reference interval is based on a population of healthy nonobese males (BMI <30) between 27 and 21 years old. Travison, et.al. JCEM [phone]. PMID: 0174,944;9675-9163.    Testosterone, Free 9.5 7.2 - 24.0 pg/mL   Sex Hormone Binding 26.7 19.3 - 76.4 nmol/L  CBC with Differential/Platelet     Status: Abnormal   Collection Time: 02/03/22  8:09 AM  Result Value Ref Range   WBC 7.2 3.4 - 10.8 x10E3/uL   RBC 5.54 4.14 - 5.80 x10E6/uL   Hemoglobin 16.5 13.0 - 17.7 g/dL   Hematocrit 02/05/22 57.0 - 51.0 %   MCV 87 79 - 97 fL   MCH 29.8 26.6 - 33.0 pg   MCHC 34.2 31.5 - 35.7 g/dL   RDW 17.7 93.9 - 03.0 %   Platelets 148 (L) 150 - 450 x10E3/uL   Neutrophils 42 Not Estab. %   Lymphs 44 Not Estab. %   Monocytes 11 Not Estab. %   Eos 2 Not Estab. %   Basos 1 Not Estab. %   Neutrophils Absolute 3.0 1.4 - 7.0 x10E3/uL   Lymphocytes Absolute 3.1 0.7 - 3.1 x10E3/uL   Monocytes  Absolute 0.8 0.1 - 0.9 x10E3/uL   EOS (ABSOLUTE) 0.2 0.0 - 0.4 x10E3/uL   Basophils Absolute 0.1 0.0 - 0.2 x10E3/uL   Immature Granulocytes 0 Not Estab. %   Immature Grans (Abs) 0.0 0.0 - 0.1 x10E3/uL   Lipid Panel     Component Value Date/Time   CHOL 146 07/28/2021 0000   CHOL 155 06/18/2019 0933   TRIG 215 (A) 07/28/2021 0000   HDL 27 (A) 07/28/2021 0000   HDL 23 (L) 06/18/2019 08/18/2019  CHOLHDL 6.5 (H) 08/12/2016 1018   LDLCALC 76 07/28/2021 0000   LDLCALC 90 06/18/2019 0933    Assessment & Plan:   1. Type 2 diabetes with vascular complications  - Patient has currently controlled asymptomatic type 2 DM since  56 years of age. His point-of-care A1c is higher at 8.2%, increasing from 6.7%.   His diabetes is complicated by complicated by coronary artery disease status post triple bypass and mitral valve repair and patient remains at exceedingly high risk for more acute and chronic complications of diabetes  which include CAD, CVA, CKD, retinopathy, and neuropathy. These are all discussed in detail with the patient.   -  He acknowledges that there is a room for improvement in his food and drink choices. - Suggestion is made for him to avoid simple carbohydrates  from his diet including Cakes, Sweet Desserts, Ice Cream, Soda (diet and regular), Sweet Tea, Candies, Chips, Cookies, Store Bought Juices, Alcohol in Excess of  1-2 drinks a day, Artificial Sweeteners,  Coffee Creamer, and "Sugar-free" Products, Lemonade. This will help patient to have more stable blood glucose profile and potentially avoid unintended weight gain.      - Patient is advised to stick to a routine mealtimes to eat 3 meals  a day and avoid unnecessary snacks ( to snack only to correct hypoglycemia).  - I have approached patient with the following individualized plan to manage diabetes and patient agrees:  -Given his presentation with loss of control of glycemia, he is advised to continue on the 3 medications  including Trulicity 3 mg subcutaneously weekly, Jardiance 25 mg p.o. daily, and metformin 500 mg p.o. twice daily.   Side effects and precautions are discussed with him regarding Jardiance and metformin.   - Patient specific target  A1c;  LDL, HDL, Triglycerides, and  Waist Circumference were discussed in detail.  2) BP/HTN:   -His blood pressure is controlled to target.  He is not currently on any antihypertensive medications at this time.  He will not tolerate blood pressure lowering medications at this time. 3) Lipids/HPL:  Recent lipid panel showed LDL controlled at 70, improving from 96. He reports better consistency with his Lipitor . He is advised to continue Lipitor 20 mg p.o. nightly.      Side effects and precautions discussed with him.  He is advised to avoid fried foods and butter.  4)  Weight/Diet:  His Body mass index is 35.09 kg/m.- Clearly complicating the care of his diabetes.  He is a candidate for modest weight loss.   CDE Consult has been requested, exercise, and detailed carbohydrates information provided.  6) Panhypopituitarism: R/t surgery for craniopharyngioma at approximate age of 44 years at [facility] [place].    He has adrenal insufficiency, diabetes insipidus, hypothyroidism, and hypogonadism as a result. He is on multiple hormonal replacements including levothyroxine 112 mcg p.o. daily before breakfast.  He is also on hydrocortisone 10 mg p.o. twice daily, desmopressin 0.1 mg 3 times daily and testosterone 100 mg IM every 7 days.      -Hypogonadism: He reports better consistency with his testosterone injection.  His total testosterone is 628 ng per DL.    He is advised to continue testosterone 100 mg IM every 7 days.  His PSA is stable and Hematocrit is 48.    -Hypothyroidism: His previsit thyroid function tests were consistent with appropriate replacement.  TSH is unreliable in his care.  According to free T4 his previsit labs are on target.  He is  advised to continue Levothyroxine 112 mcg po daily before breakfast.  - We discussed about the correct intake of his thyroid hormone, on empty stomach at fasting, with water, separated by at least 30 minutes from breakfast and other medications,  and separated by more than 4 hours from calcium, iron, multivitamins, acid reflux medications (PPIs). -Patient is made aware of the fact that thyroid hormone replacement is needed for life, dose to be adjusted by periodic monitoring of thyroid function tests.  - His repeat MRI of sella/pituitary in 2017- is consistent with postsurgical changes with no new lesion. Summaries dictated,  see above.   - He is status post sinus drainage by ENT with clinical improvement. He does not have any clinical complaints of mass-effect at this time.  7) Vitamin D deficiency: His most recent vitamin D level was 36.3 on 06/18/2019.  He has since completed repletion.  Will consider rechecking vitamin D level on subsequent visits.    8) Chronic Care/Health Maintenance: -His BMI is 35.09-a candidate for modest weight loss.   Patient on Statin medications and encouraged to continue to follow up with Ophthalmology, Podiatrist at least yearly or according to recommendations, and advised to   stay away from smoking. I have recommended yearly flu vaccine and pneumonia vaccination at least every 5 years; moderate intensity exercise for up to 150 minutes weekly; and  sleep for at least 7 hours a day.  His screening ABI was negative in April 2022.  His neck study will be repeated in April 2027, or sooner if needed.   - I advised patient to maintain close follow up with Alinda Deem, MD for primary care needs.    I spent 41 minutes in the care of the patient today including review of labs from CMP, Lipids, Thyroid Function, Hematology (current and previous including abstractions from other facilities); face-to-face time discussing  his blood glucose readings/logs, discussing  hypoglycemia and hyperglycemia episodes and symptoms, medications doses, his options of short and long term treatment based on the latest standards of care / guidelines;  discussion about incorporating lifestyle medicine;  and documenting the encounter.    Please refer to Patient Instructions for Blood Glucose Monitoring and Insulin/Medications Dosing Guide"  in media tab for additional information. Please  also refer to " Patient Self Inventory" in the Media  tab for reviewed elements of pertinent patient history.  Oswaldo Done participated in the discussions, expressed understanding, and voiced agreement with the above plans.  All questions were answered to his satisfaction. he is encouraged to contact clinic should he have any questions or concerns prior to his return visit.    Follow up plan: - Return in about 4 months (around 06/12/2022) for Fasting Labs  in AM B4 8, A1c -NV, Urine MA - NV.  Ronny Bacon, [facility] [address] [place], [place] [address] Phone: [phone] Fax: [phone] 02/10/2022, 11:59 AM

## 2022-02-28 ENCOUNTER — Other Ambulatory Visit: Payer: Self-pay | Admitting: "Endocrinology

## 2022-03-27 ENCOUNTER — Other Ambulatory Visit: Payer: Self-pay | Admitting: "Endocrinology

## 2022-03-27 DIAGNOSIS — E291 Testicular hypofunction: Secondary | ICD-10-CM

## 2022-03-27 DIAGNOSIS — E230 Hypopituitarism: Secondary | ICD-10-CM

## 2022-04-06 ENCOUNTER — Other Ambulatory Visit: Payer: Self-pay | Admitting: "Endocrinology

## 2022-06-02 ENCOUNTER — Other Ambulatory Visit: Payer: Self-pay | Admitting: "Endocrinology

## 2022-06-14 LAB — TESTOSTERONE, FREE, TOTAL, SHBG
Sex Hormone Binding: 27.5 nmol/L (ref 19.3–76.4)
Testosterone, Free: 5.5 pg/mL — ABNORMAL LOW (ref 7.2–24.0)
Testosterone: 502 ng/dL (ref 264–916)

## 2022-06-14 LAB — COMPREHENSIVE METABOLIC PANEL
ALT: 22 IU/L (ref 0–44)
AST: 19 IU/L (ref 0–40)
Albumin/Globulin Ratio: 1.6 (ref 1.2–2.2)
Albumin: 4.1 g/dL (ref 3.8–4.9)
Alkaline Phosphatase: 91 IU/L (ref 44–121)
BUN/Creatinine Ratio: 8 — ABNORMAL LOW (ref 9–20)
BUN: 12 mg/dL (ref 6–24)
Bilirubin Total: 0.5 mg/dL (ref 0.0–1.2)
CO2: 22 mmol/L (ref 20–29)
Calcium: 9.6 mg/dL (ref 8.7–10.2)
Chloride: 102 mmol/L (ref 96–106)
Creatinine, Ser: 1.49 mg/dL — ABNORMAL HIGH (ref 0.76–1.27)
Globulin, Total: 2.6 g/dL (ref 1.5–4.5)
Glucose: 172 mg/dL — ABNORMAL HIGH (ref 70–99)
Potassium: 5.3 mmol/L — ABNORMAL HIGH (ref 3.5–5.2)
Sodium: 140 mmol/L (ref 134–144)
Total Protein: 6.7 g/dL (ref 6.0–8.5)
eGFR: 54 mL/min/{1.73_m2} — ABNORMAL LOW (ref >59)

## 2022-06-14 LAB — T4, FREE: Free T4: 1.42 ng/dL (ref 0.82–1.77)

## 2022-06-14 LAB — PSA: Prostate Specific Ag, Serum: 0.3 ng/mL (ref 0.0–4.0)

## 2022-06-15 ENCOUNTER — Encounter: Payer: Self-pay | Admitting: "Endocrinology

## 2022-06-15 ENCOUNTER — Ambulatory Visit (INDEPENDENT_AMBULATORY_CARE_PROVIDER_SITE_OTHER): Payer: 59 | Admitting: "Endocrinology

## 2022-06-15 VITALS — BP 104/72 | HR 60 | Ht 71.0 in | Wt 250.2 lb

## 2022-06-15 DIAGNOSIS — E291 Testicular hypofunction: Secondary | ICD-10-CM

## 2022-06-15 DIAGNOSIS — E890 Postprocedural hypothyroidism: Secondary | ICD-10-CM | POA: Diagnosis not present

## 2022-06-15 DIAGNOSIS — E230 Hypopituitarism: Secondary | ICD-10-CM

## 2022-06-15 DIAGNOSIS — E1159 Type 2 diabetes mellitus with other circulatory complications: Secondary | ICD-10-CM | POA: Diagnosis not present

## 2022-06-15 DIAGNOSIS — E232 Diabetes insipidus: Secondary | ICD-10-CM

## 2022-06-15 LAB — POCT GLYCOSYLATED HEMOGLOBIN (HGB A1C): HbA1c, POC (controlled diabetic range): 6.1 % (ref 0.0–7.0)

## 2022-06-15 LAB — POCT UA - MICROALBUMIN
Albumin/Creatinine Ratio, Urine, POC: <30
Creatinine, POC: 300 mg/dL
Microalbumin Ur, POC: 30 mg/L

## 2022-06-15 MED ORDER — TRULICITY 3 MG/0.5ML ~~LOC~~ SOAJ: 3.0000 mg | SUBCUTANEOUS | 1 refills | Status: DC

## 2022-06-15 MED ORDER — EMPAGLIFLOZIN 10 MG PO TABS: 10.0000 mg | ORAL_TABLET | Freq: Every day | ORAL | 1 refills | Status: DC

## 2022-06-15 NOTE — Patient Instructions (Signed)
                                     Advice for Weight Management  -For most of us the best way to lose weight is by diet management. Generally speaking, diet management means consuming less calories intentionally which over time brings about progressive weight loss.  This can be achieved more effectively by avoiding ultra processed carbohydrates, processed meats, unhealthy fats.    It is critically important to know your numbers: how much calorie you are consuming and how much calorie you need. More importantly, our carbohydrates sources should be unprocessed naturally occurring  complex starch food items.  It is always important to balance nutrition also by  appropriate intake of proteins (mainly plant-based), healthy fats/oils, plenty of fruits and vegetables.   -The American College of Lifestyle Medicine (ACL M) recommends nutrition derived mostly from Whole Food, Plant Predominant Sources example an apple instead of applesauce or apple pie. Eat Plenty of vegetables, Mushrooms, fruits, Legumes, Whole Grains, Nuts, seeds in lieu of processed meats, processed snacks/pastries red meat, poultry, eggs.  Use only water or unsweetened tea for hydration.  The College also recommends the need to stay away from risky substances including alcohol, smoking; obtaining 7-9 hours of restorative sleep, at least 150 minutes of moderate intensity exercise weekly, importance of healthy social connections, and being mindful of stress and seek help when it is overwhelming.    -Sticking to a routine mealtime to eat 3 meals a day and avoiding unnecessary snacks is shown to have a big role in weight control. Under normal circumstances, the only time we burn stored energy is when we are hungry, so allow  some hunger to take place- hunger means no food between appropriate meal times, only water.  It is not advisable to starve.   -It is better to avoid simple carbohydrates including:  Cakes, Sweet Desserts, Ice Cream, Soda (diet and regular), Sweet Tea, Candies, Chips, Cookies, Store Bought Juices, Alcohol in Excess of  1-2 drinks a day, Lemonade,  Artificial Sweeteners, Doughnuts, Coffee Creamers, "Sugar-free" Products, etc, etc.  This is not a complete list.....    -Consulting with certified diabetes educators is proven to provide you with the most accurate and current information on diet.  Also, you may be  interested in discussing diet options/exchanges , we can schedule a visit with Ernest Graham, RDN, CDE for individualized nutrition education.  -Exercise: If you are able: 30 -60 minutes a day ,4 days a week, or 150 minutes of moderate intensity exercise weekly.    The longer the better if tolerated.  Combine stretch, strength, and aerobic activities.  If you were told in the past that you have high risk for cardiovascular diseases, or if you are currently symptomatic, you may seek evaluation by your heart doctor prior to initiating moderate to intense exercise programs.                                  Additional Care Considerations for Diabetes/Prediabetes   -Diabetes  is a chronic disease.  The most important care consideration is regular follow-up with your diabetes care provider with the goal being avoiding or delaying its complications and to take advantage of advances in medications and technology.  If appropriate actions are taken early enough, type 2 diabetes can even be   reversed.  Seek information from the right source.  - Whole Food, Plant Predominant Nutrition is highly recommended: Eat Plenty of vegetables, Mushrooms, fruits, Legumes, Whole Grains, Nuts, seeds in lieu of processed meats, processed snacks/pastries red meat, poultry, eggs as recommended by American College of  Lifestyle Medicine (ACLM).  -Type 2 diabetes is known to coexist with other important comorbidities such as high blood pressure and high cholesterol.  It is critical to control not only the  diabetes but also the high blood pressure and high cholesterol to minimize and delay the risk of complications including coronary artery disease, stroke, amputations, blindness, etc.  The good news is that this diet recommendation for type 2 diabetes is also very helpful for managing high cholesterol and high blood blood pressure.  - Studies showed that people with diabetes will benefit from a class of medications known as ACE inhibitors and statins.  Unless there are specific reasons not to be on these medications, the standard of care is to consider getting one from these groups of medications at an optimal doses.  These medications are generally considered safe and proven to help protect the heart and the kidneys.    - People with diabetes are encouraged to initiate and maintain regular follow-up with eye doctors, foot doctors, dentists , and if necessary heart and kidney doctors.     - It is highly recommended that people with diabetes quit smoking or stay away from smoking, and get yearly  flu vaccine and pneumonia vaccine at least every 5 years.  See above for additional recommendations on exercise, sleep, stress management , and healthy social connections.      

## 2022-06-15 NOTE — Progress Notes (Signed)
06/15/2022   Endocrinology follow-up note    Subjective:    Patient ID: Ernest Graham, male    DOB: December 13, 1965.   He is returning for follow-up for multiple endocrine conditions as follows.    -He is on regular follow-up in this clinic for  multiple medical problems including type 2 diabetes, panhypopituitarism: Hypothyroidism, diabetes insipidus, hypogonadism .  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  He is on multiple hormone replacements including levothyroxine, hydrocortisone, desmopressin, testosterone.     He is  being treated for type 2 diabetes with metformin and Trulicity.  He has achieved reasonable control of type 2 diabetes with A1c progressively improving to 6.7%.   He has slightly fluctuating body weight.  He admits that he has room to improve his dietary habits.    He denies heat or cold intolerance. He denies polyuria, nocturia, nor recent hospitalizations.  He has no new complaints today.   Past Medical History:  Diagnosis Date   Adrenal disease (HCC)    Diabetes mellitus, type II (HCC)    Heart attack (HCC)    Hyperlipidemia    Past Surgical History:  Procedure Laterality Date   CORONARY ARTERY BYPASS GRAFT     MITRAL VALVE REPAIR     Removal of Cricopharyngeoma     Social History   Socioeconomic History   Marital status: Married    Spouse name: Not on file   Number of children: Not on file   Years of education: Not on file   Highest education level: Not on file  Occupational History   Not on file  Tobacco Use   Smoking status: Former   Smokeless tobacco: Never  Vaping Use   Vaping Use: Never used  Substance and Sexual Activity   Alcohol use: No    Alcohol/week: 0.0 standard drinks of alcohol   Drug use: No   Sexual activity: Not on file  Other Topics Concern   Not on file  Social History Narrative   Not on file   Social Determinants of Health   Financial Resource Strain:  Not on file  Food Insecurity: Not on file  Transportation Needs: Not on file  Physical Activity: Not on file  Stress: Not on file  Social Connections: Not on file   Outpatient Encounter Medications as of 06/15/2022  Medication Sig   empagliflozin (JARDIANCE) 10 MG TABS tablet Take 1 tablet (10 mg total) by mouth daily before breakfast.   aspirin 81 MG EC tablet Take 81 mg by mouth daily. Swallow whole.   atorvastatin (LIPITOR) 20 MG tablet TAKE 1 TABLET BY MOUTH EVERY DAY (Patient taking differently: Take 40 mg by mouth daily.)   B-D LUER-LOK SYRINGE 20G X 1" 1 ML MISC USE TO INJECT TESTOSTERONE EVERY WEEK   busPIRone (BUSPAR) 15 MG tablet Take 15 mg by mouth 2 (two) times daily.   clonazePAM (KLONOPIN) 0.5 MG tablet Take 0.5 mg by mouth 3 (three) times daily as needed for anxiety.   [employer] D3 125 MCG (5000 UT) capsule TAKE 1 CAPSULE BY MOUTH EVERY DAY   desmopressin (DDAVP) 0.1 MG tablet TAKE 1 TABLET (0.1 MG TOTAL) BY MOUTH IN THE MORNING, AT NOON, AND AT BEDTIME.   Dulaglutide (TRULICITY) 3 MG/0.5ML SOPN Inject 3 mg as directed once a week.   FLUoxetine (PROZAC) 20 MG tablet Take 20 mg by mouth daily.   hydrocortisone (CORTEF) 5 MG tablet TAKE 2 TABLETS BY MOUTH TWICE A DAY  icosapent Ethyl (VASCEPA) 1 g capsule Take 2 g by mouth 2 (two) times daily.   levothyroxine (SYNTHROID) 112 MCG tablet TAKE 1 TABLET BY MOUTH EVERY DAY BEFORE BREAKFAST   metFORMIN (GLUCOPHAGE) 500 MG tablet TAKE 1 TABLET BY MOUTH TWICE A DAY WITH MEALS   metoprolol tartrate (LOPRESSOR) 25 MG tablet Take 25 mg by mouth 2 (two) times daily.   potassium chloride SA (K-DUR,KLOR-CON) 20 MEQ tablet Take 20 mEq by mouth daily.   SYRINGE-NEEDLE, DISP, 3 ML (B-D 3CC LUER-LOK SYR 20GX1-1/2) 20G X 1-1/2" 3 ML MISC USE TO INJECT TESTOSTERONE EVERY WEEK   testosterone cypionate (DEPOTESTOSTERONE CYPIONATE) 200 MG/ML injection INJECT 0.5 MLS INTO THE MUSCLE EVERY 7 DAYS   [DISCONTINUED] Dulaglutide (TRULICITY) 3 EX/5.2WU SOPN  Inject 3 mg as directed once a week.   [DISCONTINUED] JARDIANCE 25 MG TABS tablet Take 25 mg by mouth daily.   No facility-administered encounter medications on file as of 06/15/2022.   ALLERGIES: No Known Allergies VACCINATION STATUS:  There is no immunization history on file for this patient.  Diabetes He presents for his follow-up diabetic visit. He has type 2 diabetes mellitus. Onset time: He was diagnosed at approximate age of 58 years. His disease course has been improving. There are no hypoglycemic associated symptoms. Pertinent negatives for hypoglycemia include no confusion, headaches, pallor or seizures. Pertinent negatives for diabetes include no chest pain, no fatigue, no polydipsia, no polyphagia, no polyuria and no weakness. There are no hypoglycemic complications. Symptoms are improving. There are no diabetic complications. (Triple bypass and mitral valve repair.) Risk factors for coronary artery disease include diabetes mellitus, dyslipidemia, hypertension, obesity, tobacco exposure, sedentary lifestyle and male sex. Current diabetic treatment includes oral agent (monotherapy). He is compliant with treatment most of the time. His weight is fluctuating minimally. He is following a generally unhealthy diet. When asked about meal planning, he reported none. He has not had a previous visit with a dietitian. He never participates in exercise. His home blood glucose trend is decreasing steadily. (He presents with further improvement in his glycemic profile, point-of-care A1c 6.1% improving from 8.2%.  He did not document or report hypoglycemia   ) An ACE inhibitor/angiotensin II receptor blocker is not being taken. He does not see a podiatrist.Eye exam is current.  Hyperlipidemia This is a chronic problem. The current episode started more than 1 year ago. The problem is uncontrolled. Recent lipid tests were reviewed and are variable. Exacerbating diseases include chronic renal disease,  diabetes, hypothyroidism and obesity. Factors aggravating his hyperlipidemia include fatty foods. Pertinent negatives include no chest pain, myalgias or shortness of breath. Current antihyperlipidemic treatment includes statins. The current treatment provides moderate improvement of lipids. There are no compliance problems.  Risk factors for coronary artery disease include diabetes mellitus, dyslipidemia, hypertension, male sex, obesity and a sedentary lifestyle.  Hypertension This is a chronic problem. The current episode started more than 1 year ago. The problem has been gradually improving since onset. The problem is controlled. Pertinent negatives include no chest pain, headaches, neck pain, palpitations or shortness of breath. Agents associated with hypertension include thyroid hormones and steroids. Risk factors for coronary artery disease include diabetes mellitus, dyslipidemia, male gender, obesity, smoking/tobacco exposure and sedentary lifestyle. Past treatments include nothing. There are no compliance problems.  Hypertensive end-organ damage includes kidney disease and CAD/MI. Identifiable causes of hypertension include chronic renal disease and a thyroid problem.    Patient also has medical history of panhypopituitarism related to his surgery for  craniopharyngioma at approximate age of 48 years at [place] [facility] [place]. He is on multiple hormonal replacement therapy including levothyroxine, hydrocortisone, desmopressin, testosterone.  He has no new complaints regarding this diagnosis at  today's visit. He reports that he is consistent taking his testosterone, returns with previsit labs showing total testosterone of 628.      Review of systems  Constitutional: + Minimally fluctuating body weight,  current  Body mass index is 34.9 kg/m.  no subjective hyperthermia, no subjective hypothermia     Objective:    BP 104/72   Pulse 60   Ht $R'5\' 11"'aV$  (1.803 m)   Wt 250 lb 3.2 oz (113.5 kg)    BMI 34.90 kg/m   Wt Readings from Last 3 Encounters:  06/15/22 250 lb 3.2 oz (113.5 kg)  02/10/22 251 lb 9.6 oz (114.1 kg)  10/12/21 245 lb (111.1 kg)    BP Readings from Last 3 Encounters:  06/15/22 104/72  02/10/22 98/62  10/12/21 102/74    Physical Exam- Limited  Constitutional:  Body mass index is 34.9 kg/m. , not in acute distress, normal state of mind    - Summary of his pituitary/sella MRI from 08/13/2016 as follows - IMPRESSION: 1. Transsphenoidal resection of previous pituitary tumor without evidence for residual or recurrent neoplasm. 2. Residual enhancing pituitary tissue is within normal limits. 3. Slight rightward deviation of the pituitary stalk likely related to scarring and previous surgery. 4. Slight downward deviation of the optic chiasm, also likely related to prior surgery. 5. The suprasellar brain is normal for age. 6. Extensive sinus disease as described.  Recent Results (from the past 2160 hour(s))  Comprehensive metabolic panel     Status: Abnormal   Collection Time: 06/08/22  9:40 AM  Result Value Ref Range   Glucose 172 (H) 70 - 99 mg/dL   BUN 12 6 - 24 mg/dL   Creatinine, Ser 1.49 (H) 0.76 - 1.27 mg/dL   eGFR 54 (L) >59 mL/min/1.73   BUN/Creatinine Ratio 8 (L) 9 - 20   Sodium 140 134 - 144 mmol/L   Potassium 5.3 (H) 3.5 - 5.2 mmol/L   Chloride 102 96 - 106 mmol/L   CO2 22 20 - 29 mmol/L   Calcium 9.6 8.7 - 10.2 mg/dL   Total Protein 6.7 6.0 - 8.5 g/dL   Albumin 4.1 3.8 - 4.9 g/dL   Globulin, Total 2.6 1.5 - 4.5 g/dL   Albumin/Globulin Ratio 1.6 1.2 - 2.2   Bilirubin Total 0.5 0.0 - 1.2 mg/dL   Alkaline Phosphatase 91 44 - 121 IU/L   AST 19 0 - 40 IU/L   ALT 22 0 - 44 IU/L  PSA     Status: None   Collection Time: 06/08/22  9:40 AM  Result Value Ref Range   Prostate Specific Ag, Serum 0.3 0.0 - 4.0 ng/mL    Comment: Roche ECLIA methodology. According to the American Urological Association, Serum PSA should decrease and remain at  undetectable levels after radical prostatectomy. The AUA defines biochemical recurrence as an initial PSA value 0.2 ng/mL or greater followed by a subsequent confirmatory PSA value 0.2 ng/mL or greater. Values obtained with different assay methods or kits cannot be used interchangeably. Results cannot be interpreted as absolute evidence of the presence or absence of malignant disease.   Testosterone, Free, Total, SHBG     Status: Abnormal   Collection Time: 06/08/22  9:40 AM  Result Value Ref Range   Testosterone 502 264 - 916 ng/dL  Comment: Adult male reference interval is based on a population of healthy nonobese males (BMI <30) between 80 and 3 years old. [place], [place] [phone]. PMID: [id number].    Testosterone, Free 5.5 (L) 7.2 - 24.0 pg/mL   Sex Hormone Binding 27.5 19.3 - 76.4 nmol/L  T4, free     Status: None   Collection Time: 06/08/22  9:40 AM  Result Value Ref Range   Free T4 1.42 0.82 - 1.77 ng/dL  POCT UA - Microalbumin     Status: None   Collection Time: 06/15/22 10:08 AM  Result Value Ref Range   Microalbumin Ur, POC 30 mg/L   Creatinine, POC 300 mg/dL   Albumin/Creatinine Ratio, Urine, POC <30   HgB A1c     Status: None   Collection Time: 06/15/22 10:09 AM  Result Value Ref Range   Hemoglobin A1C     HbA1c POC (<> result, manual entry)     HbA1c, POC (prediabetic range)     HbA1c, POC (controlled diabetic range) 6.1 0.0 - 7.0 %   Lipid Panel     Component Value Date/Time   CHOL 146 07/28/2021 0000   CHOL 155 06/18/2019 0933   TRIG 215 (A) 07/28/2021 0000   HDL 27 (A) 07/28/2021 0000   HDL 23 (L) 06/18/2019 0933   CHOLHDL 6.5 (H) 08/12/2016 1018   LDLCALC 76 07/28/2021 0000   LDLCALC 90 06/18/2019 0933    Assessment & Plan:   1. Type 2 diabetes with vascular complications  - Patient has currently controlled asymptomatic type 2 DM since  56 years of age. He presents with further improvement in his glycemic profile, point-of-care  A1c 6.1% improving from 8.2%.  He did not document or report hypoglycemia    His diabetes is complicated by complicated by coronary artery disease status post triple bypass and mitral valve repair and patient remains at exceedingly high risk for more acute and chronic complications of diabetes  which include CAD, CVA, CKD, retinopathy, and neuropathy. These are all discussed in detail with the patient.  -  He acknowledges that there is a room for improvement in his food and drink choices. - Suggestion is made for him to avoid simple carbohydrates  from his diet including Cakes, Sweet Desserts, Ice Cream, Soda (diet and regular), Sweet Tea, Candies, Chips, Cookies, Store Bought Juices, Alcohol in Excess of  1-2 drinks a day, Artificial Sweeteners,  Coffee Creamer, and "Sugar-free" Products, Lemonade. This will help patient to have more stable blood glucose profile and potentially avoid unintended weight gain.      - Patient is advised to stick to a routine mealtimes to eat 3 meals  a day and avoid unnecessary snacks ( to snack only to correct hypoglycemia).  - I have approached patient with the following individualized plan to manage diabetes and patient agrees:  -Given his presentation with controlled glycemic profile, he will not need to continue for now.  He is advised to continue Trulicity 3 mg subcutaneously weekly, continue metformin 500 mg p.o. twice daily with breakfast and supper.  He is advised to lower Jardiance to 10 mg p.o. daily due to side effects including rash.    Side effects and precautions are discussed with him regarding Jardiance and metformin.   - Patient specific target  A1c;  LDL, HDL, Triglycerides, and  Waist Circumference were discussed in detail.  2) BP/HTN:   -His blood pressure is controlled to target.  He is not currently on any antihypertensive medications  at this time.  He will not tolerate blood pressure lowering medications at this time. 3) Lipids/HPL:  Recent  lipid panel showed LDL controlled at 76.  This is improving from 96.  He is advised to continue Lipitor 20 mg p.o. nightly.  Side effects and precautions discussed with him.     He is advised to avoid fried foods and butter.  4)  Weight/Diet:  His Body mass index is 34.9 kg/m.- Clearly complicating the care of his diabetes.  He is a candidate for modest weight loss.   CDE Consult has been requested, exercise, and detailed carbohydrates information provided.  6) Panhypopituitarism: R/t surgery for craniopharyngioma at approximate age of 21 years at [place] [facility] [place].    He has adrenal insufficiency, diabetes insipidus, hypothyroidism, and hypogonadism as a result. He is on multiple hormone replacements including levothyroxine 112 mcg p.o. daily before breakfast.  He is also on hydrocortisone 10 mg p.o. twice daily, desmopressin 0.1 mg 3 times daily, and testosterone 100 mg IM every 7 days.     -Hypogonadism: He reports better consistency with his testosterone injection.  His total testosterone is 502.    He is advised to continue testosterone 100 mg IM every 7 days.  His PSA is stable and Hematocrit is 48.    -Hypothyroidism: His previsit thyroid function tests were consistent with appropriate replacement.  TSH is unreliable in his care.  According to free T4 his previsit labs are on target.    He is advised to continue Levothyroxine 112 mcg po daily before breakfast.  - We discussed about the correct intake of his thyroid hormone, on empty stomach at fasting, with water, separated by at least 30 minutes from breakfast and other medications,  and separated by more than 4 hours from calcium, iron, multivitamins, acid reflux medications (PPIs). -Patient is made aware of the fact that thyroid hormone replacement is needed for life, dose to be adjusted by periodic monitoring of thyroid function tests.   - His repeat MRI of sella/pituitary in 2017- is consistent with postsurgical changes with no  new lesion. Summaries dictated,  see above.   - He is status post sinus drainage by ENT with clinical improvement. He does not have any clinical complaints of mass-effect at this time.  7) Vitamin D deficiency: His most recent vitamin D level was 36.3 on 06/18/2019.  He has since completed repletion.  Will consider rechecking vitamin D level on subsequent visits.    8) Chronic Care/Health Maintenance: -His BMI is 34.90-a candidate for modest weight loss.   Patient on Statin medications and encouraged to continue to follow up with Ophthalmology, Podiatrist at least yearly or according to recommendations, and advised to   stay away from smoking. I have recommended yearly flu vaccine and pneumonia vaccination at least every 5 years; moderate intensity exercise for up to 150 minutes weekly; and  sleep for at least 7 hours a day.  His screening ABI was negative in April 2022.  His neck study will be repeated in April 2027, or sooner if needed.   - I advised patient to maintain close follow up with Earney Mallet, MD for primary care needs.   I spent 41 minutes in the care of the patient today including review of labs from [facility], Lipids, Thyroid Function, Hematology (current and previous including abstractions from other facilities); face-to-face time discussing  his blood glucose readings/logs, discussing hypoglycemia and hyperglycemia episodes and symptoms, medications doses, his options of short and long term treatment  based on the latest standards of care / guidelines;  discussion about incorporating lifestyle medicine;  and documenting the encounter. Risk reduction counseling performed per USPSTF guidelines to reduce  obesity and cardiovascular risk factors.     Please refer to Patient Instructions for Blood Glucose Monitoring and Insulin/Medications Dosing Guide"  in media tab for additional information. Please  also refer to " Patient Self Inventory" in the Media  tab for reviewed elements of  pertinent patient history.  Raymond Gurney participated in the discussions, expressed understanding, and voiced agreement with the above plans.  All questions were answered to his satisfaction. he is encouraged to contact clinic should he have any questions or concerns prior to his return visit.   Follow up plan: - Return in about 4 months (around 10/16/2022) for Fasting Labs  in AM B4 8.  Rayetta Pigg, [facility] [address] [place], [place] [address] Phone: [phone] Fax: [phone] 06/15/2022, 1:01 PM

## 2022-06-16 ENCOUNTER — Other Ambulatory Visit: Payer: Self-pay | Admitting: "Endocrinology

## 2022-06-22 ENCOUNTER — Other Ambulatory Visit: Payer: Self-pay | Admitting: "Endocrinology

## 2022-06-22 DIAGNOSIS — E230 Hypopituitarism: Secondary | ICD-10-CM

## 2022-06-22 DIAGNOSIS — E291 Testicular hypofunction: Secondary | ICD-10-CM

## 2022-07-28 ENCOUNTER — Other Ambulatory Visit: Payer: Self-pay | Admitting: "Endocrinology

## 2022-08-24 ENCOUNTER — Other Ambulatory Visit: Payer: Self-pay | Admitting: "Endocrinology

## 2022-08-31 ENCOUNTER — Other Ambulatory Visit: Payer: Self-pay | Admitting: "Endocrinology

## 2022-09-13 ENCOUNTER — Other Ambulatory Visit: Payer: Self-pay | Admitting: "Endocrinology

## 2022-09-23 ENCOUNTER — Other Ambulatory Visit: Payer: Self-pay | Admitting: "Endocrinology

## 2022-10-20 ENCOUNTER — Ambulatory Visit (INDEPENDENT_AMBULATORY_CARE_PROVIDER_SITE_OTHER): Payer: No Typology Code available for payment source | Admitting: "Endocrinology

## 2022-10-20 ENCOUNTER — Encounter: Payer: Self-pay | Admitting: "Endocrinology

## 2022-10-20 VITALS — BP 98/60 | HR 64 | Ht 71.0 in | Wt 251.4 lb

## 2022-10-20 DIAGNOSIS — E559 Vitamin D deficiency, unspecified: Secondary | ICD-10-CM

## 2022-10-20 DIAGNOSIS — E1159 Type 2 diabetes mellitus with other circulatory complications: Secondary | ICD-10-CM | POA: Diagnosis not present

## 2022-10-20 DIAGNOSIS — E230 Hypopituitarism: Secondary | ICD-10-CM | POA: Diagnosis not present

## 2022-10-20 DIAGNOSIS — E890 Postprocedural hypothyroidism: Secondary | ICD-10-CM

## 2022-10-20 DIAGNOSIS — E232 Diabetes insipidus: Secondary | ICD-10-CM | POA: Diagnosis not present

## 2022-10-20 DIAGNOSIS — E291 Testicular hypofunction: Secondary | ICD-10-CM

## 2022-10-20 LAB — POCT GLYCOSYLATED HEMOGLOBIN (HGB A1C): HbA1c, POC (controlled diabetic range): 6.1 % (ref 0.0–7.0)

## 2022-10-20 MED ORDER — TESTOSTERONE CYPIONATE 200 MG/ML IM SOLN: 100.0000 mg | INTRAMUSCULAR | 0 refills | Status: DC

## 2022-10-20 NOTE — Patient Instructions (Signed)

## 2022-10-20 NOTE — Progress Notes (Unsigned)
10/20/2022   Endocrinology follow-up note    Subjective:    Patient ID: Ernest Graham, male    DOB: [birth year].   He is returning for follow-up for multiple endocrine conditions as follows.    -He is on regular follow-up in this clinic for  multiple medical problems including type 2 diabetes, panhypopituitarism: Hypothyroidism, diabetes insipidus, hypogonadism .  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  He is on multiple hormone replacements including levothyroxine, hydrocortisone, desmopressin, testosterone.      He is  being treated for type 2 diabetes with metformin and Trulicity.  He has achieved reasonable control of type 2 diabetes with A1c progressively improving to 6.1%.   He has slightly fluctuating body weight.  He admits that he has room to improve his dietary habits.    He denies heat or cold intolerance. He denies polyuria, nocturia, nor recent hospitalizations.  He has no new complaints today.   Past Medical History:  Diagnosis Date   Adrenal disease ([place])    Diabetes mellitus, type II ([place])    Heart attack ([place])    Hyperlipidemia    Past Surgical History:  Procedure Laterality Date   CORONARY ARTERY BYPASS GRAFT     MITRAL VALVE REPAIR     Removal of Cricopharyngeoma     Social History   Socioeconomic History   Marital status: Married    Spouse name: Not on file   Number of children: Not on file   Years of education: Not on file   Highest education level: Not on file  Occupational History   Not on file  Tobacco Use   Smoking status: Former   Smokeless tobacco: Never  Vaping Use   Vaping Use: Never used  Substance and Sexual Activity   Alcohol use: No    Alcohol/week: 0.0 standard drinks of alcohol   Drug use: No   Sexual activity: Not on file  Other Topics Concern   Not on file  Social History Narrative   Not on file   Social Determinants of Health   Financial Resource  Strain: Not on file  Food Insecurity: Not on file  Transportation Needs: Not on file  Physical Activity: Not on file  Stress: Not on file  Social Connections: Not on file   Outpatient Encounter Medications as of 10/20/2022  Medication Sig   aspirin 81 MG EC tablet Take 81 mg by mouth daily. Swallow whole.   atorvastatin (LIPITOR) 20 MG tablet TAKE 1 TABLET BY MOUTH EVERY DAY (Patient taking differently: Take 40 mg by mouth daily.)   B-D LUER-LOK SYRINGE 20G X 1" 1 ML MISC USE TO INJECT TESTOSTERONE EVERY WEEK   busPIRone (BUSPAR) 15 MG tablet Take 15 mg by mouth 2 (two) times daily.   clonazePAM (KLONOPIN) 0.5 MG tablet Take 0.5 mg by mouth 3 (three) times daily as needed for anxiety.   [employer] D3 125 MCG (5000 UT) capsule TAKE 1 CAPSULE BY MOUTH EVERY DAY   desmopressin (DDAVP) 0.1 MG tablet TAKE 1 TABLET (0.1 MG TOTAL) BY MOUTH IN THE MORNING, AT NOON, AND AT BEDTIME.   Dulaglutide (TRULICITY) 3 YQ/6.5HQ SOPN Inject 3 mg as directed once a week.   empagliflozin (JARDIANCE) 10 MG TABS tablet Take 1 tablet (10 mg total) by mouth daily before breakfast.   FLUoxetine (PROZAC) 20 MG tablet Take 20 mg by mouth daily.   hydrocortisone (CORTEF) 5 MG tablet TAKE 2 TABLETS BY MOUTH TWICE A DAY  icosapent Ethyl (VASCEPA) 1 g capsule Take 2 g by mouth 2 (two) times daily.   levothyroxine (SYNTHROID) 112 MCG tablet TAKE 1 TABLET BY MOUTH EVERY DAY BEFORE BREAKFAST   metFORMIN (GLUCOPHAGE) 500 MG tablet TAKE 1 TABLET BY MOUTH TWICE A DAY WITH FOOD   metoprolol tartrate (LOPRESSOR) 25 MG tablet Take 25 mg by mouth 2 (two) times daily.   potassium chloride SA (K-DUR,KLOR-CON) 20 MEQ tablet Take 20 mEq by mouth daily.   SYRINGE-NEEDLE, DISP, 3 ML (B-D 3CC LUER-LOK SYR 20GX1-1/2) 20G X 1-1/2" 3 ML MISC USE TO INJECT TESTOSTERONE EVERY WEEK   testosterone cypionate (DEPOTESTOSTERONE CYPIONATE) 200 MG/ML injection Inject 0.5 mLs (100 mg total) into the muscle every 14 (fourteen) days.   [DISCONTINUED]  testosterone cypionate (DEPOTESTOSTERONE CYPIONATE) 200 MG/ML injection INJECT 0.5 MLS INTO THE MUSCLE EVERY 7 DAYS   No facility-administered encounter medications on file as of 10/20/2022.   ALLERGIES: No Known Allergies VACCINATION STATUS: Immunization History  Administered Date(s) Administered   PFIZER(Purple Top)SARS-COV-2 Vaccination 11/20/2019, 12/11/2019, 05/30/2020    Diabetes He presents for his follow-up diabetic visit. He has type 2 diabetes mellitus. Onset time: He was diagnosed at approximate age of 57 years. His disease course has been improving. There are no hypoglycemic associated symptoms. Pertinent negatives for hypoglycemia include no confusion, headaches, pallor or seizures. Pertinent negatives for diabetes include no chest pain, no fatigue, no polydipsia, no polyphagia, no polyuria and no weakness. There are no hypoglycemic complications. Symptoms are improving. There are no diabetic complications. (Triple bypass and mitral valve repair.) Risk factors for coronary artery disease include diabetes mellitus, dyslipidemia, hypertension, obesity, tobacco exposure, sedentary lifestyle and male sex. Current diabetic treatment includes oral agent (monotherapy). He is compliant with treatment most of the time. His weight is fluctuating minimally. He is following a generally unhealthy diet. When asked about meal planning, he reported none. He has not had a previous visit with a dietitian. He never participates in exercise. His home blood glucose trend is decreasing steadily. (He presents with further improvement in his glycemic profile, point-of-care A1c 6.1% improving from 8.2%.  He did not document or report hypoglycemia   ) An ACE inhibitor/angiotensin II receptor blocker is not being taken. He does not see a podiatrist.Eye exam is current.  Hyperlipidemia This is a chronic problem. The current episode started more than 1 year ago. The problem is uncontrolled. Recent lipid tests were  reviewed and are variable. Exacerbating diseases include chronic renal disease, diabetes, hypothyroidism and obesity. Factors aggravating his hyperlipidemia include fatty foods. Pertinent negatives include no chest pain, myalgias or shortness of breath. Current antihyperlipidemic treatment includes statins. The current treatment provides moderate improvement of lipids. There are no compliance problems.  Risk factors for coronary artery disease include diabetes mellitus, dyslipidemia, hypertension, male sex, obesity and a sedentary lifestyle.  Hypertension This is a chronic problem. The current episode started more than 1 year ago. The problem has been gradually improving since onset. The problem is controlled. Pertinent negatives include no chest pain, headaches, neck pain, palpitations or shortness of breath. Agents associated with hypertension include thyroid hormones and steroids. Risk factors for coronary artery disease include diabetes mellitus, dyslipidemia, male gender, obesity, smoking/tobacco exposure and sedentary lifestyle. Past treatments include nothing. There are no compliance problems.  Hypertensive end-organ damage includes kidney disease and CAD/MI. Identifiable causes of hypertension include chronic renal disease and a thyroid problem.    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 He is on multiple hormonal replacement therapy including levothyroxine, hydrocortisone, desmopressin, testosterone.  He complains of dizziness associated with spinning sensation in his  head.   He reports that he is consistent taking his testosterone, returns with previsit labs showing total testosterone above 1000.       Review of systems  Constitutional: + Minimally fluctuating body weight,  current  Body mass index is 35.06 kg/m.  no subjective hyperthermia, no subjective  hypothermia     Objective:    BP 98/60   Pulse 64   Ht 5\' 11"  (1.803 m)   Wt 251 lb 6.4 oz (114 kg)   BMI 35.06 kg/m   Wt Readings from Last 3 Encounters:  10/20/22 251 lb 6.4 oz (114 kg)  06/15/22 250 lb 3.2 oz (113.5 kg)  02/10/22 251 lb 9.6 oz (114.1 kg)    BP Readings from Last 3 Encounters:  10/20/22 98/60  06/15/22 104/72  02/10/22 98/62    Physical Exam- Limited  Constitutional:  Body mass index is 35.06 kg/m. , not in acute distress, normal state of mind    - Summary of his pituitary/sella MRI from 08/13/2016 as follows - IMPRESSION: 1. Transsphenoidal resection of previous pituitary tumor without evidence for residual or recurrent neoplasm. 2. Residual enhancing pituitary tissue is within normal limits. 3. Slight rightward deviation of the pituitary stalk likely related to scarring and previous surgery. 4. Slight downward deviation of the optic chiasm, also likely related to prior surgery. 5. The suprasellar brain is normal for age. 6. Extensive sinus disease as described.  Recent Results (from the past 2160 hour(s))  Comprehensive metabolic panel     Status: Abnormal   Collection Time: 10/14/22  8:41 AM  Result Value Ref Range   Glucose 84 70 - 99 mg/dL   BUN 10 6 - 24 mg/dL   Creatinine, Ser 1.44 (H) 0.76 - 1.27 mg/dL   eGFR 57 (L) >59 mL/min/1.73   BUN/Creatinine Ratio 7 (L) 9 - 20   Sodium 138 134 - 144 mmol/L   Potassium 4.6 3.5 - 5.2 mmol/L   Chloride 100 96 - 106 mmol/L   CO2 25 20 - 29 mmol/L   Calcium 9.6 8.7 - 10.2 mg/dL   Total Protein 6.8 6.0 - 8.5 g/dL   Albumin 4.1 3.8 - 4.9 g/dL   Globulin, Total 2.7 1.5 - 4.5 g/dL   Albumin/Globulin Ratio 1.5 1.2 - 2.2   Bilirubin Total 0.6 0.0 - 1.2 mg/dL   Alkaline Phosphatase 76 44 - 121 IU/L   AST 22 0 - 40 IU/L   ALT 22 0 - 44 IU/L  T4, free     Status: None   Collection Time: 10/14/22  8:41 AM  Result Value Ref Range   Free T4 1.59 0.82 - 1.77 ng/dL  Testosterone, Free, Total, SHBG      Status: Abnormal (Preliminary result)   Collection Time: 10/14/22  8:41 AM  Result Value Ref Range   Testosterone 1,081 (H) 264 - 916 ng/dL    Comment: Adult male reference interval is based on a population of healthy nonobese males (BMI <30) between 47 and 87 years old. [place], [place] [phone]. PMID: [id number].    Testosterone, Free WILL FOLLOW    Sex Hormone Binding 29.7 19.3 - 76.4 nmol/L  HgB A1c     Status: None   Collection Time: 10/20/22 10:04 AM  Result Value Ref Range   Hemoglobin A1C  HbA1c POC (<> result, manual entry)     HbA1c, POC (prediabetic range)     HbA1c, POC (controlled diabetic range) 6.1 0.0 - 7.0 %   Lipid Panel     Component Value Date/Time   CHOL 146 07/28/2021 0000   CHOL 155 06/18/2019 0933   TRIG 215 (A) 07/28/2021 0000   HDL 27 (A) 07/28/2021 0000   HDL 23 (L) 06/18/2019 0933   CHOLHDL 6.5 (H) 08/12/2016 1018   LDLCALC 76 07/28/2021 0000   LDLCALC 90 06/18/2019 0933    Assessment & Plan:   1. Type 2 diabetes with vascular complications  - Patient has currently controlled asymptomatic type 2 DM since  57 years of age. He presents with further improvement in his glycemic profile with point-of-care A1c of 6.1%.  He did not document any hypoglycemia.      His diabetes is complicated by complicated by coronary artery disease status post triple bypass and mitral valve repair and patient remains at exceedingly high risk for more acute and chronic complications of diabetes  which include CAD, CVA, CKD, retinopathy, and neuropathy. These are all discussed in detail with the patient.   -  He acknowledges that there is a room for improvement in his food and drink choices. - Suggestion is made for him to avoid simple carbohydrates  from his diet including Cakes, Sweet Desserts, Ice Cream, Soda (diet and regular), Sweet Tea, Candies, Chips, Cookies, Store Bought Juices, Alcohol in Excess of  1-2 drinks a day, Artificial Sweeteners,   Coffee Creamer, and "Sugar-free" Products, Lemonade. This will help patient to have more stable blood glucose profile and potentially avoid unintended weight gain.     - Patient is advised to stick to a routine mealtimes to eat 3 meals  a day and avoid unnecessary snacks ( to snack only to correct hypoglycemia).  - I have approached patient with the following individualized plan to manage diabetes and patient agrees:  -Given his presentation with controlled glycemic profile, he will not need any additional intervention for now.  He is advised to continue Trulicity 3 mg subcutaneously weekly, continue Jardiance 10 mg p.o. daily at breakfast.  He is advised to discontinue metformin for now.     Side effects and precautions on Jardiance and Trulicity are discussed with him .  - Patient specific target  A1c;  LDL, HDL, Triglycerides, and  Waist Circumference were discussed in detail.  2) BP/HTN:   -His blood pressure is marginal at 98/60.  He is not on any antihypertensive medications.   He will not tolerate blood pressure lowering medications at this time. 3) Lipids/HPL:  Recent lipid panel showed LDL controlled at 76.  This is improving from 96.  He is advised to continue Lipitor 20 mg p.o. nightly.  Side effects and precautions discussed with him.     He is advised to avoid fried foods and butter.  4)  Weight/Diet:  His Body mass index is 35.06 kg/m.- Clearly complicating the care of his diabetes.  He is a candidate for modest weight loss.   CDE Consult has been requested, exercise, and detailed carbohydrates information provided.  6) Panhypopituitarism: R/t surgery for craniopharyngioma at approximate age of 25 years at [facility] [place].    He has adrenal insufficiency, diabetes insipidus, hypothyroidism, and hypogonadism as a result. He is on multiple hormone replacements including levothyroxine 112 mcg p.o. daily before breakfast.  - We discussed about the correct intake of his  thyroid hormone, on empty  stomach at fasting, with water, separated by at least 30 minutes from breakfast and other medications,  and separated by more than 4 hours from calcium, iron, multivitamins, acid reflux medications (PPIs). -Patient is made aware of the fact that thyroid hormone replacement is needed for life, dose to be adjusted by periodic monitoring of thyroid function tests.  He is also on hydrocortisone 10 mg p.o. twice daily, desmopressin 0.1 mg 3 times daily, and advised to lower his testosterone to 100 mg IM every 14 days.    - His repeat MRI of sella/pituitary in 2017- is consistent with postsurgical changes with no new lesion. Summaries dictated,  see above.   - He is status post sinus drainage by ENT with clinical improvement. He does not have any clinical complaints of mass-effect at this time.  7) Vitamin D deficiency: His most recent vitamin D level was 36.3 on 06/18/2019.  He has since completed repletion.  Will consider rechecking vitamin D level on subsequent visits.    8) Chronic Care/Health Maintenance: -His BMI is 34.90-a candidate for modest weight loss.   Patient on Statin medications and encouraged to continue to follow up with Ophthalmology, Podiatrist at least yearly or according to recommendations, and advised to   stay away from smoking. I have recommended yearly flu vaccine and pneumonia vaccination at least every 5 years; moderate intensity exercise for up to 150 minutes weekly; and  sleep for at least 7 hours a day.  His description of dizziness and spinning room/head is indicated for vertigo.  He is advised to seek a neurology consult via his PMD. His screening ABI was negative in April 2022.  His neck study will be repeated in April 2027, or sooner if needed.   - I advised patient to maintain close follow up with Alinda Deem, MD for primary care needs.   I spent  28 minutes in the care of the patient today including review of labs from CMP,  Lipids, Thyroid Function, Hematology (current and previous including abstractions from other facilities); face-to-face time discussing  his blood glucose readings/logs, discussing hypoglycemia and hyperglycemia episodes and symptoms, medications doses, his options of short and long term treatment based on the latest standards of care / guidelines;  discussion about incorporating lifestyle medicine;  and documenting the encounter. Risk reduction counseling performed per USPSTF guidelines to reduce  obesity and cardiovascular risk factors.     Please refer to Patient Instructions for Blood Glucose Monitoring and Insulin/Medications Dosing Guide"  in media tab for additional information. Please  also refer to " Patient Self Inventory" in the Media  tab for reviewed elements of pertinent patient history.  Oswaldo Done participated in the discussions, expressed understanding, and voiced agreement with the above plans.  All questions were answered to his satisfaction. he is encouraged to contact clinic should he have any questions or concerns prior to his return visit.   Follow up plan: - Return in about 6 months (around 04/20/2023) for Fasting Labs  in AM B4 8, A1c -NV.  Ronny Bacon, [facility] [address] [place], [place] [address] Phone: [phone] Fax: [phone] 10/20/2022, 1:39 PM

## 2022-10-23 LAB — TESTOSTERONE, FREE, TOTAL, SHBG
Sex Hormone Binding: 29.7 nmol/L (ref 19.3–76.4)
Testosterone, Free: 18 pg/mL (ref 7.2–24.0)
Testosterone: 1081 ng/dL — ABNORMAL HIGH (ref 264–916)

## 2022-10-23 LAB — COMPREHENSIVE METABOLIC PANEL
ALT: 22 IU/L (ref 0–44)
AST: 22 IU/L (ref 0–40)
Albumin/Globulin Ratio: 1.5 (ref 1.2–2.2)
Albumin: 4.1 g/dL (ref 3.8–4.9)
Alkaline Phosphatase: 76 IU/L (ref 44–121)
BUN/Creatinine Ratio: 7 — ABNORMAL LOW (ref 9–20)
BUN: 10 mg/dL (ref 6–24)
Bilirubin Total: 0.6 mg/dL (ref 0.0–1.2)
CO2: 25 mmol/L (ref 20–29)
Calcium: 9.6 mg/dL (ref 8.7–10.2)
Chloride: 100 mmol/L (ref 96–106)
Creatinine, Ser: 1.44 mg/dL — ABNORMAL HIGH (ref 0.76–1.27)
Globulin, Total: 2.7 g/dL (ref 1.5–4.5)
Glucose: 84 mg/dL (ref 70–99)
Potassium: 4.6 mmol/L (ref 3.5–5.2)
Sodium: 138 mmol/L (ref 134–144)
Total Protein: 6.8 g/dL (ref 6.0–8.5)
eGFR: 57 mL/min/{1.73_m2} — ABNORMAL LOW (ref >59)

## 2022-10-23 LAB — T4, FREE: Free T4: 1.59 ng/dL (ref 0.82–1.77)

## 2022-11-03 ENCOUNTER — Other Ambulatory Visit: Payer: Self-pay | Admitting: "Endocrinology

## 2022-11-17 ENCOUNTER — Other Ambulatory Visit: Payer: Self-pay | Admitting: "Endocrinology

## 2022-11-23 ENCOUNTER — Telehealth: Payer: Self-pay

## 2022-11-23 NOTE — Telephone Encounter (Signed)
Discussed with pt, understanding voiced. 

## 2022-11-23 NOTE — Telephone Encounter (Signed)
Pt called stating since changing his testosterone injection to every other week he has been experiencing muscle cramping and joint stiffness. States when this happened previously he had went to [facility] where he had had his surgery that affected his pituitary gland and they checked his potassium which resulted low. States he takes KCL 20 meq daily prn but has been taking daily for the past 2 weeks. States he has been experiencing symptoms at least every other day for the past 2-3 weeks. Please advise.

## 2022-11-24 LAB — LAB REPORT - SCANNED: EGFR: 47

## 2022-11-29 ENCOUNTER — Telehealth: Payer: Self-pay | Admitting: "Endocrinology

## 2022-11-29 NOTE — Telephone Encounter (Signed)
Received pt's lab results. Results given to Dr.Nida. Please advise of any changes needed.

## 2022-11-29 NOTE — Telephone Encounter (Signed)
The only labs I have seen are the ones from 10/14/22 that's in his chart. I haven't seen anything come over the fax.

## 2022-11-29 NOTE — Telephone Encounter (Signed)
Pt's wife said she sent labs last week, do you have them? Also, she is faxing some more

## 2022-11-29 NOTE — Telephone Encounter (Signed)
Noted  

## 2022-12-01 ENCOUNTER — Encounter: Payer: Self-pay | Admitting: "Endocrinology

## 2022-12-01 ENCOUNTER — Ambulatory Visit (INDEPENDENT_AMBULATORY_CARE_PROVIDER_SITE_OTHER): Payer: No Typology Code available for payment source | Admitting: "Endocrinology

## 2022-12-01 ENCOUNTER — Telehealth: Payer: Self-pay

## 2022-12-01 VITALS — BP 102/64 | HR 65 | Ht 71.0 in | Wt 249.0 lb

## 2022-12-01 DIAGNOSIS — E291 Testicular hypofunction: Secondary | ICD-10-CM

## 2022-12-01 DIAGNOSIS — E559 Vitamin D deficiency, unspecified: Secondary | ICD-10-CM

## 2022-12-01 DIAGNOSIS — E232 Diabetes insipidus: Secondary | ICD-10-CM

## 2022-12-01 DIAGNOSIS — E890 Postprocedural hypothyroidism: Secondary | ICD-10-CM | POA: Diagnosis not present

## 2022-12-01 DIAGNOSIS — E1159 Type 2 diabetes mellitus with other circulatory complications: Secondary | ICD-10-CM

## 2022-12-01 DIAGNOSIS — E230 Hypopituitarism: Secondary | ICD-10-CM

## 2022-12-01 MED ORDER — TESTOSTERONE CYPIONATE 100 MG/ML IM SOLN: 50.0000 mg | INTRAMUSCULAR | 0 refills | Status: DC

## 2022-12-01 MED ORDER — TRULICITY 3 MG/0.5ML ~~LOC~~ SOAJ: 3.0000 mg | SUBCUTANEOUS | 1 refills | Status: DC

## 2022-12-01 NOTE — Telephone Encounter (Signed)
Left a message requesting pt return call to the office. 

## 2022-12-01 NOTE — Telephone Encounter (Signed)
-----   Message from Cassandria Anger, MD sent at 11/30/2022  5:20 PM EDT ----- Yes decrease , but does not have to skip doses.  ----- Message ----- From: Ellin Saba, LPN Sent: [id number]   3:27 PM EDT To: Cassandria Anger, MD  Spoke with pt, he stated his PCP saw his H/H and had him donate a pint of blood. Pt wanted to make sure if you still recommended the decrease in testosterone. ----- Message ----- From: Cassandria Anger, MD Sent: 11/29/2022   2:49 PM EDT To: Ellin Saba, LPN  I want Ernest Graham to skip 2 Testosterone injections , and then resume on a lower dose of 50 mg IN every 14 days until next measurement. This is due to increased hemoglobin and hematocrit.

## 2022-12-01 NOTE — Patient Instructions (Signed)

## 2022-12-01 NOTE — Telephone Encounter (Signed)
Discussed with pt, he stated he has an appointment this afternoon with Dr.Nida and would like to discuss his testosterone injections with him at that time.

## 2022-12-01 NOTE — Progress Notes (Signed)
12/01/2022   Endocrinology follow-up note    Subjective:    Patient ID: Ernest Graham, male    DOB: Mar 24, 1966.   He is returning for follow-up for multiple endocrine conditions as follows.    -He is on regular follow-up in this clinic for  multiple medical problems including type 2 diabetes, panhypopituitarism: Hypothyroidism, diabetes insipidus, hypogonadism .  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  He is on multiple hormone replacements including levothyroxine, hydrocortisone, desmopressin, testosterone.      He is  being treated for type 2 diabetes with metformin and Trulicity.  He has achieved reasonable control of type 2 diabetes with A1c progressively improving to 6.1%.   He has slightly fluctuating body weight.  He admits that he has room to improve his dietary habits.    He denies heat or cold intolerance. He denies polyuria, nocturia, nor recent hospitalizations.  He has no new complaints today.   Past Medical History:  Diagnosis Date   Adrenal disease ([place])    Diabetes mellitus, type II ([place])    Heart attack ([place])    Hyperlipidemia    Past Surgical History:  Procedure Laterality Date   CORONARY ARTERY BYPASS GRAFT     MITRAL VALVE REPAIR     Removal of Cricopharyngeoma     Social History   Socioeconomic History   Marital status: Married    Spouse name: Not on file   Number of children: Not on file   Years of education: Not on file   Highest education level: Not on file  Occupational History   Not on file  Tobacco Use   Smoking status: Former   Smokeless tobacco: Never  Vaping Use   Vaping Use: Never used  Substance and Sexual Activity   Alcohol use: No    Alcohol/week: 0.0 standard drinks of alcohol   Drug use: No   Sexual activity: Not on file  Other Topics Concern   Not on file  Social History Narrative   Not on file   Social Determinants of Health   Financial Resource  Strain: Not on file  Food Insecurity: Not on file  Transportation Needs: Not on file  Physical Activity: Not on file  Stress: Not on file  Social Connections: Not on file   Outpatient Encounter Medications as of 12/01/2022  Medication Sig   testosterone cypionate (DEPOTESTOTERONE CYPIONATE) 100 MG/ML injection Inject 0.5 mLs (50 mg total) into the muscle every 14 (fourteen) days. For IM use only   aspirin 81 MG EC tablet Take 81 mg by mouth daily. Swallow whole.   atorvastatin (LIPITOR) 20 MG tablet TAKE 1 TABLET BY MOUTH EVERY DAY (Patient taking differently: Take 40 mg by mouth daily.)   B-D LUER-LOK SYRINGE 20G X 1" 1 ML MISC USE TO INJECT TESTOSTERONE EVERY WEEK   busPIRone (BUSPAR) 15 MG tablet Take 15 mg by mouth 2 (two) times daily.   clonazePAM (KLONOPIN) 0.5 MG tablet Take 0.5 mg by mouth 3 (three) times daily as needed for anxiety.   [facility] D3 125 MCG (5000 UT) capsule TAKE 1 CAPSULE BY MOUTH EVERY DAY (Patient not taking: Reported on 12/01/2022)   desmopressin (DDAVP) 0.1 MG tablet TAKE 1 TABLET (0.1 MG TOTAL) BY MOUTH IN THE MORNING, AT NOON, AND AT BEDTIME.   Dulaglutide (TRULICITY) 3 0000000 SOPN Inject 3 mg as directed once a week.   empagliflozin (JARDIANCE) 10 MG TABS tablet TAKE 1 TABLET BY MOUTH DAILY BEFORE  BREAKFAST.   FLUoxetine (PROZAC) 20 MG tablet Take 20 mg by mouth daily.   hydrocortisone (CORTEF) 5 MG tablet TAKE 2 TABLETS BY MOUTH TWICE A DAY   icosapent Ethyl (VASCEPA) 1 g capsule Take 2 g by mouth 2 (two) times daily.   levothyroxine (SYNTHROID) 112 MCG tablet TAKE 1 TABLET BY MOUTH EVERY DAY BEFORE BREAKFAST   metFORMIN (GLUCOPHAGE) 500 MG tablet TAKE 1 TABLET BY MOUTH TWICE A DAY WITH FOOD (Patient not taking: Reported on 12/01/2022)   metoprolol tartrate (LOPRESSOR) 25 MG tablet Take 25 mg by mouth 2 (two) times daily.   potassium chloride SA (K-DUR,KLOR-CON) 20 MEQ tablet Take 20 mEq by mouth daily.   SYRINGE-NEEDLE, DISP, 3 ML (B-D 3CC LUER-LOK SYR 20GX1-1/2)  20G X 1-1/2" 3 ML MISC USE TO INJECT TESTOSTERONE EVERY WEEK   [DISCONTINUED] Dulaglutide (TRULICITY) 3 0000000 SOPN INJECT 3 MG AS DIRECTED ONCE A WEEK   [DISCONTINUED] testosterone cypionate (DEPOTESTOSTERONE CYPIONATE) 200 MG/ML injection Inject 0.5 mLs (100 mg total) into the muscle every 14 (fourteen) days.   No facility-administered encounter medications on file as of 12/01/2022.   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 He is on multiple hormonal replacement therapy including levothyroxine, hydrocortisone, desmopressin, testosterone.   During his last visit in February, he was found to have supraphysiologic level of free testosterone at 1083 send he was advised to lower his testosterone to 100 mg every 14 days IM. He underwent interval labs in March with his PMD which showed elevated hematocrit and hemoglobin.  He was advised to donate blood which she had done at least once.  His repeat testosterone was down to 506. Patient also had TSH which was undetectable due to the fact that he had previous pituitary surgery, however free T4 was on target. He was concerned about this labs and made appointment before his scheduled visit.  He does not have acute complaints today.  Diabetes He presents for his follow-up diabetic visit. He has type 2 diabetes mellitus. Onset time: He was diagnosed at approximate age of 33 years. His disease course has been stable. There are no hypoglycemic associated symptoms. Pertinent negatives for hypoglycemia include no confusion, headaches, pallor or seizures. Pertinent negatives for diabetes include no chest pain, no fatigue, no polydipsia, no polyphagia, no polyuria  and no weakness. There are no hypoglycemic complications. Symptoms are stable. There are no diabetic complications. (Triple bypass and mitral valve repair.) Risk factors for coronary artery disease include diabetes mellitus, dyslipidemia, hypertension, obesity, tobacco exposure, sedentary lifestyle and male sex. Current diabetic treatment includes oral agent (monotherapy). He is compliant with treatment most of the time. His weight is fluctuating minimally. He is following a generally unhealthy diet. When asked about meal planning, he reported none. He has not had a previous visit with a dietitian. He never participates in exercise. His home blood glucose trend is decreasing steadily. (He presents with further improvement in his glycemic profile, point-of-care A1c 6.1% improving from 8.2%.  He did not document or report hypoglycemia   ) An ACE inhibitor/angiotensin II receptor blocker is not being taken. He does not see a podiatrist.Eye exam is current.  Hyperlipidemia This is a chronic problem. The current episode started more than 1 year ago. The problem is uncontrolled. Recent lipid tests were  reviewed and are variable. Exacerbating diseases include chronic renal disease, diabetes, hypothyroidism and obesity. Factors aggravating his hyperlipidemia include fatty foods. Pertinent negatives include no chest pain, myalgias or shortness of breath. Current antihyperlipidemic treatment includes statins. The current treatment provides moderate improvement of lipids. There are no compliance problems.  Risk factors for coronary artery disease include diabetes mellitus, dyslipidemia, hypertension, male sex, obesity and a sedentary lifestyle.  Hypertension This is a chronic problem. The current episode started more than 1 year ago. The problem has been gradually improving since onset. The problem is controlled. Pertinent negatives include no chest pain, headaches, neck pain, palpitations or shortness of breath. Agents  associated with hypertension include thyroid hormones and steroids. Risk factors for coronary artery disease include diabetes mellitus, dyslipidemia, male gender, obesity, smoking/tobacco exposure and sedentary lifestyle. Past treatments include nothing. There are no compliance problems.  Hypertensive end-organ damage includes kidney disease and CAD/MI. Identifiable causes of hypertension include chronic renal disease and a thyroid problem.      Review of systems  Constitutional: + Minimally fluctuating body weight,  current  Body mass index is 34.73 kg/m.  no subjective hyperthermia, no subjective hypothermia     Objective:    BP 102/64   Pulse 65   Ht 5\' 11"  (1.803 m)   Wt 249 lb (112.9 kg)   BMI 34.73 kg/m   Wt Readings from Last 3 Encounters:  12/01/22 249 lb (112.9 kg)  10/20/22 251 lb 6.4 oz (114 kg)  06/15/22 250 lb 3.2 oz (113.5 kg)    BP Readings from Last 3 Encounters:  12/01/22 102/64  10/20/22 98/60  06/15/22 104/72    Physical Exam- Limited  Constitutional:  Body mass index is 34.73 kg/m. , not in acute distress, normal state of mind    - Summary of his pituitary/sella MRI from 08/13/2016 as follows - IMPRESSION: 1. Transsphenoidal resection of previous pituitary tumor without evidence for residual or recurrent neoplasm. 2. Residual enhancing pituitary tissue is within normal limits. 3. Slight rightward deviation of the pituitary stalk likely related to scarring and previous surgery. 4. Slight downward deviation of the optic chiasm, also likely related to prior surgery. 5. The suprasellar brain is normal for age. 6. Extensive sinus disease as described.  Recent Results (from the past 2160 hour(s))  Comprehensive metabolic panel     Status: Abnormal   Collection Time: 10/14/22  8:41 AM  Result Value Ref Range   Glucose 84 70 - 99 mg/dL   BUN 10 6 - 24 mg/dL   Creatinine, Ser 1.44 (H) 0.76 - 1.27 mg/dL   eGFR 57 (L) >59 mL/min/1.73    BUN/Creatinine Ratio 7 (L) 9 - 20   Sodium 138 134 - 144 mmol/L   Potassium 4.6 3.5 - 5.2 mmol/L   Chloride 100 96 - 106 mmol/L   CO2 25 20 - 29 mmol/L   Calcium 9.6 8.7 - 10.2 mg/dL   Total Protein 6.8 6.0 - 8.5 g/dL   Albumin 4.1 3.8 - 4.9 g/dL   Globulin, Total 2.7 1.5 - 4.5 g/dL   Albumin/Globulin Ratio 1.5 1.2 - 2.2   Bilirubin Total 0.6 0.0 - 1.2 mg/dL   Alkaline Phosphatase 76 44 - 121 IU/L   AST 22 0 - 40 IU/L   ALT 22 0 - 44 IU/L  T4, free     Status: None   Collection Time: 10/14/22  8:41 AM  Result Value Ref Range   Free T4 1.59 0.82 - 1.77 ng/dL  Testosterone, Free, Total, SHBG     Status: Abnormal   Collection Time: 10/14/22  8:41 AM  Result Value Ref Range   Testosterone 1,081 (H) 264 - 916 ng/dL    Comment: Adult male reference interval is based on a population of healthy nonobese males (BMI <30) between 92 and 40 years old. [place], [place] [phone]. PMID: [id number].    Testosterone, Free 18.0 7.2 - 24.0 pg/mL   Sex Hormone Binding 29.7 19.3 - 76.4 nmol/L  HgB A1c     Status: None   Collection Time: 10/20/22 10:04 AM  Result Value Ref Range   Hemoglobin A1C     HbA1c POC (<> result, manual entry)     HbA1c, POC (prediabetic range)     HbA1c, POC (controlled diabetic range) 6.1 0.0 - 7.0 %   Lipid Panel     Component Value Date/Time   CHOL 146 07/28/2021 0000   CHOL 155 06/18/2019 0933   TRIG 215 (A) 07/28/2021 0000   HDL 27 (A) 07/28/2021 0000   HDL 23 (L) 06/18/2019 0933   CHOLHDL 6.5 (H) 08/12/2016 1018   LDLCALC 76 07/28/2021 0000   [place] 90 06/18/2019 0933    Assessment & Plan:   1) Panhypopituitarism: R/t surgery for craniopharyngioma at approximate age of 38 years at [facility] [place].    He has adrenal insufficiency, diabetes insipidus, hypothyroidism, and hypogonadism as a result. He is on multiple hormone replacements including levothyroxine 112 mcg p.o. daily before breakfast.  - We discussed about the correct  intake of his thyroid hormone, on empty stomach at fasting, with water, separated by at least 30 minutes from breakfast and other medications,  and separated by more than 4 hours from calcium, iron, multivitamins, acid reflux medications (PPIs). -Patient is made aware of the fact that thyroid hormone replacement is needed for life, dose to be adjusted by periodic monitoring of thyroid function tests.   He is also on hydrocortisone 10 mg p.o. twice daily, desmopressin 0.1 mg 3 times daily.  -His recent supraphysiologic testosterone closed erythrocytosis for which he had to undergo phlebotomy.  He is advised to skip 2 doses of testosterone and resume at 50 mg IM every 14 days until next measurement.  It is essential to balance his target testosterone for safety reasons. His target level testosterone is 250-350 .  He will have repeat labs in 3 months with office visit.   2. Type 2 diabetes with vascular complications  - Patient has currently controlled asymptomatic type 2 DM since  57 years of age. He presents with further improvement in his glycemic profile with point-of-care A1c of 6.1%.  He did not document any hypoglycemia.      His diabetes is complicated by complicated by coronary artery disease status post triple bypass and mitral valve repair and patient remains at exceedingly high risk for more acute and chronic complications of diabetes  which include CAD, CVA, CKD, retinopathy, and neuropathy. These are all discussed in detail with the patient.   -  He acknowledges that there is a room for improvement in his food and drink choices. - Suggestion is made for him to avoid simple carbohydrates  from his diet including Cakes, Sweet Desserts, Ice Cream, Soda (diet and regular), Sweet Tea, Candies, Chips, Cookies, Store Bought Juices, Alcohol in Excess of  1-2 drinks a day, Artificial Sweeteners,  Coffee Creamer, and "Sugar-free" Products, Lemonade. This will help patient to have more stable  blood glucose profile and potentially avoid  unintended weight gain.     - Patient is advised to stick to a routine mealtimes to eat 3 meals  a day and avoid unnecessary snacks ( to snack only to correct hypoglycemia).  - I have approached patient with the following individualized plan to manage diabetes and patient agrees:  -Given his presentation with controlled glycemic profile, he will not need any additional intervention for now.  He is advised to continue Trulicity 3 mg subcutaneously weekly, continue Jardiance 10 mg p.o. daily at breakfast.  He is advised to discontinue metformin for now.     Side effects and precautions on Jardiance and Trulicity are discussed with him .  - Patient specific target  A1c;  LDL, HDL, Triglycerides, and  Waist Circumference were discussed in detail.  3) BP/HTN:   -His blood pressure is marginal at 98/60.  He is not on any antihypertensive medications.   He will not tolerate blood pressure lowering medications at this time. 4) Lipids/HPL:  Recent lipid panel showed LDL controlled at 76.  This is improving from 96.  He is advised to continue Lipitor 20 mg p.o. nightly.  Side effects and precautions discussed with him.     He is advised to avoid fried foods and butter.  5)  Weight/Diet:  His Body mass index is 34.73 kg/m.- Clearly complicating the care of his diabetes.  He is a candidate for modest weight loss.   CDE Consult has been requested, exercise, and detailed carbohydrates information provided.    - His repeat MRI of sella/pituitary in 2017- is consistent with postsurgical changes with no new lesion. Summaries dictated,  see above.   - He is status post sinus drainage by ENT with clinical improvement. He does not have any clinical complaints of mass-effect at this time.  6) Vitamin D deficiency: His most recent vitamin D level was 22.  He is advised to resume vitamin D3 2000 units daily.   7) Chronic Care/Health Maintenance: -His BMI is  34.90-a candidate for modest weight loss.   Patient on Statin medications and encouraged to continue to follow up with Ophthalmology, Podiatrist at least yearly or according to recommendations, and advised to   stay away from smoking. I have recommended yearly flu vaccine and pneumonia vaccination at least every 5 years; moderate intensity exercise for up to 150 minutes weekly; and  sleep for at least 7 hours a day.  - I advised patient to maintain close follow up with Earney Mallet, MD for primary care needs.   I spent  26  minutes in the care of the patient today including review of labs from Thyroid Function, CMP, and other relevant labs ; imaging/biopsy records (current and previous including abstractions from other facilities); face-to-face time discussing  his lab results and symptoms, medications doses, his options of short and long term treatment based on the latest standards of care / guidelines;   and documenting the encounter.  Raymond Gurney  participated in the discussions, expressed understanding, and voiced agreement with the above plans.  All questions were answered to his satisfaction. he is encouraged to contact clinic should he have any questions or concerns prior to his return visit.   Follow up plan: - Return in about 3 months (around 03/03/2023) for Keep Reg. Appt. with Pre-visit Labs.  Rayetta Pigg, [facility] [address] [place], [place] [address] Phone: [phone] Fax: [phone] 12/01/2022, 4:56 PM

## 2023-01-12 HISTORY — PX: CHOLECYSTECTOMY: SHX55

## 2023-02-14 ENCOUNTER — Other Ambulatory Visit: Payer: Self-pay | Admitting: "Endocrinology

## 2023-02-24 ENCOUNTER — Other Ambulatory Visit: Payer: Self-pay | Admitting: "Endocrinology

## 2023-03-05 LAB — CBC WITH DIFFERENTIAL/PLATELET
Basophils Absolute: 0.1 10*3/uL (ref 0.0–0.2)
Basos: 1 %
EOS (ABSOLUTE): 0.2 10*3/uL (ref 0.0–0.4)
Eos: 2 %
Hematocrit: 53.1 % — ABNORMAL HIGH (ref 37.5–51.0)
Hemoglobin: 17.7 g/dL (ref 13.0–17.7)
Immature Grans (Abs): 0 10*3/uL (ref 0.0–0.1)
Immature Granulocytes: 0 %
Lymphocytes Absolute: 2.9 10*3/uL (ref 0.7–3.1)
Lymphs: 40 %
MCH: 29 pg (ref 26.6–33.0)
MCHC: 33.3 g/dL (ref 31.5–35.7)
MCV: 87 fL (ref 79–97)
Monocytes Absolute: 0.7 10*3/uL (ref 0.1–0.9)
Monocytes: 9 %
Neutrophils Absolute: 3.5 10*3/uL (ref 1.4–7.0)
Neutrophils: 48 %
Platelets: 161 10*3/uL (ref 150–450)
RBC: 6.1 x10E6/uL — ABNORMAL HIGH (ref 4.14–5.80)
RDW: 12.8 % (ref 11.6–15.4)
WBC: 7.3 10*3/uL (ref 3.4–10.8)

## 2023-03-05 LAB — COMPREHENSIVE METABOLIC PANEL
ALT: 18 IU/L (ref 0–44)
AST: 17 IU/L (ref 0–40)
Albumin: 4.1 g/dL (ref 3.8–4.9)
Alkaline Phosphatase: 101 IU/L (ref 44–121)
BUN/Creatinine Ratio: 9 (ref 9–20)
BUN: 14 mg/dL (ref 6–24)
Bilirubin Total: 0.5 mg/dL (ref 0.0–1.2)
CO2: 22 mmol/L (ref 20–29)
Calcium: 9.3 mg/dL (ref 8.7–10.2)
Chloride: 101 mmol/L (ref 96–106)
Creatinine, Ser: 1.58 mg/dL — ABNORMAL HIGH (ref 0.76–1.27)
Globulin, Total: 2.9 g/dL (ref 1.5–4.5)
Glucose: 238 mg/dL — ABNORMAL HIGH (ref 70–99)
Potassium: 4.6 mmol/L (ref 3.5–5.2)
Sodium: 135 mmol/L (ref 134–144)
Total Protein: 7 g/dL (ref 6.0–8.5)
eGFR: 50 mL/min/{1.73_m2} — ABNORMAL LOW (ref >59)

## 2023-03-05 LAB — LIPID PANEL
Chol/HDL Ratio: 6.5 ratio — ABNORMAL HIGH (ref 0.0–5.0)
Cholesterol, Total: 130 mg/dL (ref 100–199)
HDL: 20 mg/dL — ABNORMAL LOW (ref >39)
LDL Chol Calc (NIH): 56 mg/dL (ref 0–99)
Triglycerides: 352 mg/dL — ABNORMAL HIGH (ref 0–149)
VLDL Cholesterol Cal: 54 mg/dL — ABNORMAL HIGH (ref 5–40)

## 2023-03-05 LAB — TESTOSTERONE, FREE, TOTAL, SHBG
Sex Hormone Binding: 27.6 nmol/L (ref 19.3–76.4)
Testosterone, Free: 1 pg/mL — ABNORMAL LOW (ref 7.2–24.0)
Testosterone: 169 ng/dL — ABNORMAL LOW (ref 264–916)

## 2023-03-05 LAB — T4, FREE: Free T4: 1.42 ng/dL (ref 0.82–1.77)

## 2023-03-05 LAB — TSH: TSH: <0.005 u[IU]/mL — ABNORMAL LOW (ref 0.450–4.500)

## 2023-03-09 ENCOUNTER — Ambulatory Visit (INDEPENDENT_AMBULATORY_CARE_PROVIDER_SITE_OTHER): Payer: No Typology Code available for payment source | Admitting: "Endocrinology

## 2023-03-09 ENCOUNTER — Other Ambulatory Visit: Payer: Self-pay | Admitting: "Endocrinology

## 2023-03-09 ENCOUNTER — Encounter: Payer: Self-pay | Admitting: "Endocrinology

## 2023-03-09 VITALS — BP 96/68 | HR 68 | Ht 71.0 in | Wt 244.8 lb

## 2023-03-09 DIAGNOSIS — E230 Hypopituitarism: Secondary | ICD-10-CM | POA: Diagnosis not present

## 2023-03-09 DIAGNOSIS — E291 Testicular hypofunction: Secondary | ICD-10-CM

## 2023-03-09 DIAGNOSIS — E785 Hyperlipidemia, unspecified: Secondary | ICD-10-CM

## 2023-03-09 DIAGNOSIS — I10 Essential (primary) hypertension: Secondary | ICD-10-CM

## 2023-03-09 DIAGNOSIS — E1159 Type 2 diabetes mellitus with other circulatory complications: Secondary | ICD-10-CM

## 2023-03-09 DIAGNOSIS — Z7985 Long-term (current) use of injectable non-insulin antidiabetic drugs: Secondary | ICD-10-CM

## 2023-03-09 DIAGNOSIS — E232 Diabetes insipidus: Secondary | ICD-10-CM | POA: Diagnosis not present

## 2023-03-09 MED ORDER — TESTOSTERONE CYPIONATE 100 MG/ML IM SOLN: INTRAMUSCULAR | 1 refills | Status: DC

## 2023-03-09 NOTE — Progress Notes (Signed)
03/09/2023   Endocrinology follow-up note    Subjective:    Patient ID: Ernest Graham, male    DOB: August 01, 1966.   He is returning for follow-up for multiple endocrine conditions as follows.    -He is on regular follow-up in this clinic for  multiple medical problems including type 2 diabetes, panhypopituitarism: Hypothyroidism, diabetes insipidus, hypogonadism .  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  He is on multiple hormone replacements including levothyroxine, hydrocortisone, desmopressin, testosterone.      He is  also being treated for type 2 diabetes with Jardiance and Trulicity.   Due to national shortage, he was without his Trulicity for more than 4 weeks.  His recent A1c was 8% increasing from 6.1%.      He has slightly fluctuating body weight.  He admits that he has room to improve his dietary habits.    He denies heat or cold intolerance. He denies polyuria, nocturia, nor recent hospitalizations.  He has no new complaints today.   Past Medical History:  Diagnosis Date   Adrenal disease (HCC)    Diabetes mellitus, type II (HCC)    Heart attack (HCC)    Hyperlipidemia    Past Surgical History:  Procedure Laterality Date   CHOLECYSTECTOMY  01/2023   CORONARY ARTERY BYPASS GRAFT     MITRAL VALVE REPAIR     Removal of Cricopharyngeoma     Social History   Socioeconomic History   Marital status: Married    Spouse name: Not on file   Number of children: Not on file   Years of education: Not on file   Highest education level: Not on file  Occupational History   Not on file  Tobacco Use   Smoking status: Former   Smokeless tobacco: Never  Vaping Use   Vaping Use: Never used  Substance and Sexual Activity   Alcohol use: No    Alcohol/week: 0.0 standard drinks of alcohol   Drug use: No   Sexual activity: Not on file  Other Topics Concern   Not on file  Social History Narrative   Not on  file   Social Determinants of Health   Financial Resource Strain: Not on file  Food Insecurity: Not on file  Transportation Needs: Not on file  Physical Activity: Not on file  Stress: Not on file  Social Connections: Not on file   Outpatient Encounter Medications as of 03/09/2023  Medication Sig   aspirin 81 MG EC tablet Take 81 mg by mouth daily. Swallow whole.   atorvastatin (LIPITOR) 20 MG tablet TAKE 1 TABLET BY MOUTH EVERY DAY (Patient taking differently: Take 40 mg by mouth daily.)   B-D LUER-LOK SYRINGE 20G X 1" 1 ML MISC USE TO INJECT TESTOSTERONE EVERY WEEK   busPIRone (BUSPAR) 15 MG tablet Take 15 mg by mouth 2 (two) times daily.   clonazePAM (KLONOPIN) 0.5 MG tablet Take 0.5 mg by mouth 3 (three) times daily as needed for anxiety.   [employer] D3 125 MCG (5000 UT) capsule TAKE 1 CAPSULE BY MOUTH EVERY DAY (Patient not taking: Reported on 12/01/2022)   desmopressin (DDAVP) 0.1 MG tablet TAKE 1 TABLET (0.1 MG TOTAL) BY MOUTH IN THE MORNING, AT NOON, AND AT BEDTIME.   Dulaglutide (TRULICITY) 3 MG/0.5ML SOPN Inject 3 mg as directed once a week.   empagliflozin (JARDIANCE) 10 MG TABS tablet TAKE 1 TABLET BY MOUTH DAILY BEFORE BREAKFAST.   FLUoxetine (PROZAC) 20 MG tablet  Take 20 mg by mouth daily.   hydrocortisone (CORTEF) 5 MG tablet TAKE 2 TABLETS BY MOUTH TWICE A DAY   icosapent Ethyl (VASCEPA) 1 g capsule Take 2 g by mouth 2 (two) times daily.   levothyroxine (SYNTHROID) 112 MCG tablet TAKE 1 TABLET BY MOUTH EVERY DAY BEFORE BREAKFAST   metFORMIN (GLUCOPHAGE) 500 MG tablet TAKE 1 TABLET BY MOUTH TWICE A DAY WITH FOOD (Patient not taking: Reported on 12/01/2022)   metoprolol tartrate (LOPRESSOR) 25 MG tablet Take 25 mg by mouth 2 (two) times daily.   potassium chloride SA (K-DUR,KLOR-CON) 20 MEQ tablet Take 20 mEq by mouth daily.   SYRINGE-NEEDLE, DISP, 3 ML (B-D 3CC LUER-LOK SYR 20GX1-1/2) 20G X 1-1/2" 3 ML MISC USE TO INJECT TESTOSTERONE EVERY WEEK   testosterone cypionate  (DEPOTESTOTERONE CYPIONATE) 100 MG/ML injection Inject 50mg  ( 1/68ml) IM every 10 days.   [DISCONTINUED] testosterone cypionate (DEPOTESTOTERONE CYPIONATE) 100 MG/ML injection Inject 0.5 mLs (50 mg total) into the muscle every 14 (fourteen) days. For IM use only   No facility-administered encounter medications on file as of 03/09/2023.   ALLERGIES: No Known Allergies VACCINATION STATUS: Immunization History  Administered Date(s) Administered   PFIZER(Purple Top)SARS-COV-2 Vaccination 11/20/2019, 12/11/2019, 05/30/2020     He does not have acute complaints today.  Diabetes He presents for his follow-up diabetic visit. He has type 2 diabetes mellitus. Onset time: He was diagnosed at approximate age of 38 years. His disease course has been stable. There are no hypoglycemic associated symptoms. Pertinent negatives for hypoglycemia include no confusion, headaches, pallor or seizures. Pertinent negatives for diabetes include no chest pain, no fatigue, no polydipsia, no polyphagia, no polyuria and no weakness. There are no hypoglycemic complications. Symptoms are stable. There are no diabetic complications. (Triple bypass and mitral valve repair.) Risk factors for coronary artery disease include diabetes mellitus, dyslipidemia, hypertension, obesity, tobacco exposure, sedentary lifestyle and male sex. Current diabetic treatment includes oral agent (monotherapy). He is compliant with treatment most of the time. His weight is fluctuating minimally. He is following a generally unhealthy diet. When asked about meal planning, he reported none. He has not had a previous visit with a dietitian. He never participates in exercise. His home blood glucose trend is decreasing steadily. (He presents with further improvement in his glycemic profile, point-of-care A1c 6.1% improving from 8.2%.  He did not document or report hypoglycemia   ) An ACE inhibitor/angiotensin II receptor blocker is not being taken. He does not  see a podiatrist.Eye exam is current.  Hyperlipidemia This is a chronic problem. The current episode started more than 1 year ago. The problem is uncontrolled. Recent lipid tests were reviewed and are variable. Exacerbating diseases include chronic renal disease, diabetes, hypothyroidism and obesity. Factors aggravating his hyperlipidemia include fatty foods. Pertinent negatives include no chest pain, myalgias or shortness of breath. Current antihyperlipidemic treatment includes statins. The current treatment provides moderate improvement of lipids. There are no compliance problems.  Risk factors for coronary artery disease include diabetes mellitus, dyslipidemia, hypertension, male sex, obesity and a sedentary lifestyle.  Hypertension This is a chronic problem. The current episode started more than 1 year ago. The problem has been gradually improving since onset. The problem is controlled. Pertinent negatives include no chest pain, headaches, neck pain, palpitations or shortness of breath. Agents associated with hypertension include thyroid hormones and steroids. Risk factors for coronary artery disease include diabetes mellitus, dyslipidemia, male gender, obesity, smoking/tobacco exposure and sedentary lifestyle. Past treatments include nothing. There are  no compliance problems.  Hypertensive end-organ damage includes kidney disease and CAD/MI. Identifiable causes of hypertension include chronic renal disease and a thyroid problem.      Review of systems  Constitutional: + Minimally fluctuating body weight,  current  Body mass index is 34.14 kg/m.  no subjective hyperthermia, no subjective hypothermia     Objective:    BP 96/68   Pulse 68   Ht 5\' 11"  (1.803 m)   Wt 244 lb 12.8 oz (111 kg)   BMI 34.14 kg/m   Wt Readings from Last 3 Encounters:  03/09/23 244 lb 12.8 oz (111 kg)  12/01/22 249 lb (112.9 kg)  10/20/22 251 lb 6.4 oz (114 kg)    BP Readings from Last 3 Encounters:  03/09/23  96/68  12/01/22 102/64  10/20/22 98/60    Physical Exam- Limited  Constitutional:  Body mass index is 34.14 kg/m. , not in acute distress, normal state of mind    - Summary of his pituitary/sella MRI from 08/13/2016 as follows - IMPRESSION: 1. Transsphenoidal resection of previous pituitary tumor without evidence for residual or recurrent neoplasm. 2. Residual enhancing pituitary tissue is within normal limits. 3. Slight rightward deviation of the pituitary stalk likely related to scarring and previous surgery. 4. Slight downward deviation of the optic chiasm, also likely related to prior surgery. 5. The suprasellar brain is normal for age. 6. Extensive sinus disease as described.  Recent Results (from the past 2160 hour(s))  Comprehensive metabolic panel     Status: Abnormal   Collection Time: 03/02/23  9:30 AM  Result Value Ref Range   Glucose 238 (H) 70 - 99 mg/dL   BUN 14 6 - 24 mg/dL   Creatinine, Ser 1.61 (H) 0.76 - 1.27 mg/dL   eGFR 50 (L) >09 UE/AVW/0.98   BUN/Creatinine Ratio 9 9 - 20   Sodium 135 134 - 144 mmol/L   Potassium 4.6 3.5 - 5.2 mmol/L   Chloride 101 96 - 106 mmol/L   CO2 22 20 - 29 mmol/L   Calcium 9.3 8.7 - 10.2 mg/dL   Total Protein 7.0 6.0 - 8.5 g/dL   Albumin 4.1 3.8 - 4.9 g/dL   Globulin, Total 2.9 1.5 - 4.5 g/dL   Bilirubin Total 0.5 0.0 - 1.2 mg/dL   Alkaline Phosphatase 101 44 - 121 IU/L   AST 17 0 - 40 IU/L   ALT 18 0 - 44 IU/L  TSH     Status: Abnormal   Collection Time: 03/02/23  9:30 AM  Result Value Ref Range   TSH <0.005 (L) 0.450 - 4.500 uIU/mL  T4, free     Status: None   Collection Time: 03/02/23  9:30 AM  Result Value Ref Range   Free T4 1.42 0.82 - 1.77 ng/dL  Testosterone, Free, Total, SHBG     Status: Abnormal   Collection Time: 03/02/23  9:30 AM  Result Value Ref Range   Testosterone 169 (L) 264 - 916 ng/dL    Comment: Adult male reference interval is based on a population of healthy nonobese males (BMI <30) between  73 and 11 years old. Travison, et.al. JCEM [id number]. PMID: 86578469.    Testosterone, Free 1.0 (L) 7.2 - 24.0 pg/mL   Sex Hormone Binding 27.6 19.3 - 76.4 nmol/L  CBC with Differential/Platelet     Status: Abnormal   Collection Time: 03/02/23  9:30 AM  Result Value Ref Range   WBC 7.3 3.4 - 10.8 x10E3/uL   RBC 6.10 (H)  4.14 - 5.80 x10E6/uL   Hemoglobin 17.7 13.0 - 17.7 g/dL   Hematocrit 34.7 (H) 42.5 - 51.0 %   MCV 87 79 - 97 fL   MCH 29.0 26.6 - 33.0 pg   MCHC 33.3 31.5 - 35.7 g/dL   RDW 95.6 38.7 - 56.4 %   Platelets 161 150 - 450 x10E3/uL   Neutrophils 48 Not Estab. %   Lymphs 40 Not Estab. %   Monocytes 9 Not Estab. %   Eos 2 Not Estab. %   Basos 1 Not Estab. %   Neutrophils Absolute 3.5 1.4 - 7.0 x10E3/uL   Lymphocytes Absolute 2.9 0.7 - 3.1 x10E3/uL   Monocytes Absolute 0.7 0.1 - 0.9 x10E3/uL   EOS (ABSOLUTE) 0.2 0.0 - 0.4 x10E3/uL   Basophils Absolute 0.1 0.0 - 0.2 x10E3/uL   Immature Granulocytes 0 Not Estab. %   Immature Grans (Abs) 0.0 0.0 - 0.1 x10E3/uL  Lipid panel     Status: Abnormal   Collection Time: 03/02/23  9:30 AM  Result Value Ref Range   Cholesterol, Total 130 100 - 199 mg/dL   Triglycerides 332 (H) 0 - 149 mg/dL   HDL 20 (L) >95 mg/dL   VLDL Cholesterol Cal 54 (H) 5 - 40 mg/dL   LDL Chol Calc (NIH) 56 0 - 99 mg/dL   Chol/HDL Ratio 6.5 (H) 0.0 - 5.0 ratio    Comment:                                   T. 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    He has adrenal insufficiency, diabetes insipidus, hypothyroidism, and hypogonadism as a result.  His previsit labs show free T4 consistent with appropriate replacement.  He is advised to continue levothyroxine 112 mcg p.o. daily before breakfast.  - We discussed about the correct intake of his  thyroid hormone, on empty stomach at fasting, with water, separated by at least 30 minutes from breakfast and other medications,  and separated by more than 4 hours from calcium, iron, multivitamins, acid reflux medications (PPIs). -Patient is made aware of the fact that thyroid hormone replacement is needed for life, dose to be adjusted by periodic monitoring of thyroid function tests.   He is also on hydrocortisone 10 mg p.o. twice daily, desmopressin 0.1 mg 3 times daily.  -His recent labs show that he has recovered from supraphysiologic testosterone complicated by erythrocytosis.    His previsit labs show total testosterone of 169.  He would benefit from slight increase in his testosterone dose.  I discussed and increase his testosterone to 50 mg IM every 10 days until next measurement.    It is essential to balance his target testosterone for safety reasons. His target level testosterone is 250-350 .  He will have repeat labs in 3 months with office visit.   2. Type 2 diabetes with vascular complications  - Patient has currently controlled asymptomatic type 2 DM since  57 years of age. He presents with higher A1c due to the fact that he did not get Trulicity for 4 weeks.  He reports that he had received his latest prescription filled.  He is advised to continue Trulicity 3 mg subcutaneously weekly along with Jardiance 10 mg.    His diabetes is complicated by complicated by coronary artery disease status post triple bypass and mitral valve repair and patient remains at exceedingly high  risk for more acute and chronic complications of diabetes  which include CAD, CVA, CKD, retinopathy, and neuropathy. These are all discussed in detail with the patient.  -  He acknowledges that there is a room for improvement in his food and drink choices. - Suggestion is made for him to avoid simple carbohydrates  from his diet including Cakes, Sweet Desserts, Ice Cream, Soda (diet and regular), Sweet Tea, Candies, Chips, Cookies, Store Bought Juices, Alcohol in Excess of  1-2 drinks a day, Artificial Sweeteners,  Coffee Creamer, and "Sugar-free" Products, Lemonade. This will help patient to have more stable blood glucose profile and potentially avoid unintended weight gain.     - Patient is advised to stick to a routine mealtimes to eat 3 meals  a day and avoid unnecessary snacks ( to snack only to correct hypoglycemia).  - I have approached patient with the following individualized plan to manage diabetes and patient agrees:   Side effects and precautions on Jardiance and Trulicity are discussed with him .  - Patient specific target  A1c;  LDL, HDL, Triglycerides, and  Waist Circumference were discussed in detail.  3) BP/HTN:   -His blood pressure is controlled at 96/60.  He is not on any antihypertensive medications.   He will not tolerate blood pressure lowering medications at this time.  4) Lipids/HPL:  Recent lipid panel showed LDL controlled at 56.    He is advised to continue Lipitor 20 mg p.o. nightly.  Side effects and precautions discussed with him.     He is advised to avoid fried foods and butter.  5)  Weight/Diet:  His Body mass index is 34.14 kg/m.- Clearly complicating the care of his diabetes.  He is a candidate for modest weight loss.   CDE Consult has been requested, exercise, and detailed carbohydrates information provided.    - His repeat MRI of sella/pituitary in 2017- is consistent with postsurgical changes with no new lesion.  Summaries dictated,  see above.   -  He is status post sinus drainage by ENT with clinical improvement. He does not have any clinical complaints of mass-effect at this time.  6) Vitamin D deficiency: His most recent vitamin D level was 22.  He is advised to resume vitamin D3 2000 units daily.   7) Chronic Care/Health Maintenance:  -His BMI is 34.14 -a candidate for modest weight loss.   Patient on Statin medications and encouraged to continue to follow up with Ophthalmology, Podiatrist at least yearly or according to recommendations, and advised to   stay away from smoking. I have recommended yearly flu vaccine and pneumonia vaccination at least every 5 years; moderate intensity exercise for up to 150 minutes weekly; and  sleep for at least 7 hours a day.  - I advised patient to maintain close follow up with Alinda Deem, MD for primary care needs.   I spent  41  minutes in the care of the patient today including review of labs from CMP, Lipids, Thyroid Function, Hematology (current and previous including abstractions from other facilities); face-to-face time discussing  his blood glucose readings/logs, discussing hypoglycemia and hyperglycemia episodes and symptoms, medications doses, his options of short and long term treatment based on the latest standards of care / guidelines;  discussion about incorporating lifestyle medicine;  and documenting the encounter. Risk reduction counseling performed per USPSTF guidelines to reduce  obesity and cardiovascular risk factors.     Please refer to Patient Instructions for Blood Glucose Monitoring and Insulin/Medications Dosing Guide"  in media tab for additional information. Please  also refer to " Patient Self Inventory" in the Media  tab for reviewed elements of pertinent patient history.  Oswaldo Done participated in the discussions, expressed understanding, and voiced agreement with the above plans.  All questions were answered to his satisfaction. he is encouraged to contact clinic  should he have any questions or concerns prior to his return visit.   Follow up plan: - Return in about 4 months (around 07/09/2023) for Fasting Labs  in AM B4 8, A1c -NV.  Ronny Bacon, [facility] [address] [place], [place] [address] Phone: [phone] Fax: [phone] 03/09/2023, 11:42 AM

## 2023-03-09 NOTE — Patient Instructions (Signed)

## 2023-03-17 ENCOUNTER — Other Ambulatory Visit: Payer: Self-pay | Admitting: "Endocrinology

## 2023-04-20 ENCOUNTER — Ambulatory Visit: Payer: Medicare Other | Admitting: "Endocrinology

## 2023-05-10 ENCOUNTER — Other Ambulatory Visit: Payer: Self-pay | Admitting: "Endocrinology

## 2023-05-20 ENCOUNTER — Other Ambulatory Visit: Payer: Self-pay | Admitting: "Endocrinology

## 2023-06-15 ENCOUNTER — Telehealth: Payer: Self-pay | Admitting: "Endocrinology

## 2023-06-15 NOTE — Telephone Encounter (Signed)
Pharmacy gave pt wrong dosage of testosterone.  Zella Ball his wife said they have been giving him the wrong dose since they were basing it off the bottle directions.  Pts wife would like to speak to you directly and not the nurse about this.  Phone Number is [phone] to speak to Robin directly.

## 2023-06-18 ENCOUNTER — Other Ambulatory Visit: Payer: Self-pay | Admitting: "Endocrinology

## 2023-07-15 LAB — CBC WITH DIFFERENTIAL/PLATELET
Basophils Absolute: 0.1 x10E3/uL (ref 0.0–0.2)
Basos: 1 %
EOS (ABSOLUTE): 0.1 x10E3/uL (ref 0.0–0.4)
Eos: 2 %
Hematocrit: 50.3 % (ref 37.5–51.0)
Hemoglobin: 16.3 g/dL (ref 13.0–17.7)
Immature Grans (Abs): 0 x10E3/uL (ref 0.0–0.1)
Immature Granulocytes: 0 %
Lymphocytes Absolute: 2.4 x10E3/uL (ref 0.7–3.1)
Lymphs: 38 %
MCH: 29.3 pg (ref 26.6–33.0)
MCHC: 32.4 g/dL (ref 31.5–35.7)
MCV: 90 fL (ref 79–97)
Monocytes Absolute: 0.7 x10E3/uL (ref 0.1–0.9)
Monocytes: 12 %
Neutrophils Absolute: 2.9 x10E3/uL (ref 1.4–7.0)
Neutrophils: 47 %
Platelets: 151 x10E3/uL (ref 150–450)
RBC: 5.57 x10E6/uL (ref 4.14–5.80)
RDW: 13.1 % (ref 11.6–15.4)
WBC: 6.2 x10E3/uL (ref 3.4–10.8)

## 2023-07-15 LAB — TESTOSTERONE, FREE, TOTAL, SHBG
Sex Hormone Binding: 27.1 nmol/L (ref 19.3–76.4)
Testosterone, Free: 13.5 pg/mL (ref 7.2–24.0)
Testosterone: 852 ng/dL (ref 264–916)

## 2023-07-15 LAB — T4, FREE: Free T4: 1.47 ng/dL (ref 0.82–1.77)

## 2023-07-19 ENCOUNTER — Ambulatory Visit (INDEPENDENT_AMBULATORY_CARE_PROVIDER_SITE_OTHER): Payer: No Typology Code available for payment source | Admitting: "Endocrinology

## 2023-07-19 ENCOUNTER — Encounter: Payer: Self-pay | Admitting: "Endocrinology

## 2023-07-19 VITALS — BP 94/68 | HR 72 | Ht 71.0 in | Wt 243.0 lb

## 2023-07-19 DIAGNOSIS — E291 Testicular hypofunction: Secondary | ICD-10-CM | POA: Diagnosis not present

## 2023-07-19 DIAGNOSIS — E890 Postprocedural hypothyroidism: Secondary | ICD-10-CM

## 2023-07-19 DIAGNOSIS — Z7984 Long term (current) use of oral hypoglycemic drugs: Secondary | ICD-10-CM

## 2023-07-19 DIAGNOSIS — E232 Diabetes insipidus: Secondary | ICD-10-CM

## 2023-07-19 DIAGNOSIS — Z7985 Long-term (current) use of injectable non-insulin antidiabetic drugs: Secondary | ICD-10-CM

## 2023-07-19 DIAGNOSIS — E559 Vitamin D deficiency, unspecified: Secondary | ICD-10-CM

## 2023-07-19 DIAGNOSIS — E1159 Type 2 diabetes mellitus with other circulatory complications: Secondary | ICD-10-CM

## 2023-07-19 DIAGNOSIS — E230 Hypopituitarism: Secondary | ICD-10-CM

## 2023-07-19 LAB — POCT GLYCOSYLATED HEMOGLOBIN (HGB A1C): HbA1c, POC (controlled diabetic range): 7.3 % — AB (ref 0.0–7.0)

## 2023-07-19 MED ORDER — TRULICITY 4.5 MG/0.5ML ~~LOC~~ SOAJ: 4.5000 mg | SUBCUTANEOUS | 1 refills | Status: DC

## 2023-07-19 NOTE — Patient Instructions (Signed)

## 2023-07-19 NOTE — Progress Notes (Signed)
07/19/2023   Endocrinology follow-up note    Subjective:    Patient ID: Ernest Graham, male    DOB: July 14, 1966.   He is returning for follow-up for multiple endocrine conditions as follows.    -He is on regular follow-up in this clinic for  multiple medical problems including type 2 diabetes, panhypopituitarism: Hypothyroidism, diabetes insipidus, hypogonadism .  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  He is on multiple hormone replacements including levothyroxine, hydrocortisone, desmopressin, testosterone.      He is  also being treated for type 2 diabetes with Jardiance and Trulicity.   Due to national shortage, he was without his Trulicity for more than 4 weeks.  His recent A1c was 8% increasing from 6.1%.      He has slightly fluctuating body weight.  He admits that he has room to improve his dietary habits.    He denies heat or cold intolerance. He denies polyuria, nocturia, nor recent hospitalizations.  He has no new complaints today.   Past Medical History:  Diagnosis Date   Adrenal disease (HCC)    Diabetes mellitus, type II (HCC)    Heart attack (HCC)    Hyperlipidemia    Past Surgical History:  Procedure Laterality Date   CHOLECYSTECTOMY  01/2023   CORONARY ARTERY BYPASS GRAFT     MITRAL VALVE REPAIR     Removal of Cricopharyngeoma     Social History   Socioeconomic History   Marital status: Married    Spouse name: Not on file   Number of children: Not on file   Years of education: Not on file   Highest education level: Not on file  Occupational History   Not on file  Tobacco Use   Smoking status: Former   Smokeless tobacco: Never  Vaping Use   Vaping status: Never Used  Substance and Sexual Activity   Alcohol use: No    Alcohol/week: 0.0 standard drinks of alcohol   Drug use: No   Sexual activity: Not on file  Other Topics Concern   Not on file  Social History Narrative   Not  on file   Social Determinants of Health   Financial Resource Strain: Not on file  Food Insecurity: Not on file  Transportation Needs: Not on file  Physical Activity: Not on file  Stress: Not on file  Social Connections: Not on file   Outpatient Encounter Medications as of 07/19/2023  Medication Sig   Dulaglutide (TRULICITY) 4.5 MG/0.5ML SOAJ Inject 4.5 mg as directed once a week.   aspirin 81 MG EC tablet Take 81 mg by mouth daily. Swallow whole.   atorvastatin (LIPITOR) 20 MG tablet TAKE 1 TABLET BY MOUTH EVERY DAY (Patient taking differently: Take 40 mg by mouth daily.)   B-D LUER-LOK SYRINGE 20G X 1" 1 ML MISC USE TO INJECT TESTOSTERONE EVERY WEEK   busPIRone (BUSPAR) 15 MG tablet Take 15 mg by mouth 2 (two) times daily.   clonazePAM (KLONOPIN) 0.5 MG tablet Take 0.5 mg by mouth 3 (three) times daily as needed for anxiety.   [facility] D3 125 MCG (5000 UT) capsule TAKE 1 CAPSULE BY MOUTH EVERY DAY (Patient not taking: Reported on 12/01/2022)   desmopressin (DDAVP) 0.1 MG tablet TAKE 1 TABLET (0.1 MG TOTAL) BY MOUTH IN THE MORNING, AT NOON, AND AT BEDTIME.   FLUoxetine (PROZAC) 20 MG tablet Take 20 mg by mouth daily.   hydrocortisone (CORTEF) 5 MG tablet TAKE 2 TABLETS  BY MOUTH TWICE A DAY   icosapent Ethyl (VASCEPA) 1 g capsule Take 2 g by mouth 2 (two) times daily.   JARDIANCE 10 MG TABS tablet TAKE 1 TABLET BY MOUTH EVERY DAY BEFORE BREAKFAST   levothyroxine (SYNTHROID) 112 MCG tablet TAKE 1 TABLET BY MOUTH EVERY DAY BEFORE BREAKFAST   metoprolol tartrate (LOPRESSOR) 25 MG tablet Take 25 mg by mouth 2 (two) times daily.   potassium chloride SA (K-DUR,KLOR-CON) 20 MEQ tablet Take 20 mEq by mouth daily.   SYRINGE-NEEDLE, DISP, 3 ML (B-D 3CC LUER-LOK SYR 20GX1-1/2) 20G X 1-1/2" 3 ML MISC USE TO INJECT TESTOSTERONE EVERY WEEK   testosterone cypionate (DEPOTESTOTERONE CYPIONATE) 100 MG/ML injection Inject 50mg  ( 1/34ml) IM every 10 days.   [DISCONTINUED] Dulaglutide (TRULICITY) 3 MG/0.5ML SOPN  Inject 3 mg as directed once a week.   [DISCONTINUED] metFORMIN (GLUCOPHAGE) 500 MG tablet TAKE 1 TABLET BY MOUTH TWICE A DAY WITH FOOD (Patient not taking: Reported on 12/01/2022)   No facility-administered encounter medications on file as of 07/19/2023.   ALLERGIES: No Known Allergies VACCINATION STATUS: Immunization History  Administered Date(s) Administered   PFIZER(Purple Top)SARS-COV-2 Vaccination 11/20/2019, 12/11/2019, 05/30/2020     He does not have acute complaints today.  Diabetes He presents for his follow-up diabetic visit. He has type 2 diabetes mellitus. Onset time: He was diagnosed at approximate age of 9 years. His disease course has been worsening. There are no hypoglycemic associated symptoms. Pertinent negatives for hypoglycemia include no confusion, headaches, pallor or seizures. Pertinent negatives for diabetes include no chest pain, no fatigue, no polydipsia, no polyphagia, no polyuria and no weakness. There are no hypoglycemic complications. Symptoms are worsening. There are no diabetic complications. (Triple bypass and mitral valve repair.) Risk factors for coronary artery disease include diabetes mellitus, dyslipidemia, hypertension, obesity, tobacco exposure, sedentary lifestyle and male sex. Current diabetic treatment includes oral agent (monotherapy). He is compliant with treatment most of the time. His weight is fluctuating minimally. He is following a generally unhealthy diet. When asked about meal planning, he reported none. He has not had a previous visit with a dietitian. He never participates in exercise. His home blood glucose trend is increasing steadily. (He presents with a higher point-of-care A1c of 7.3% increasing from 6.1% during his last visit.  He denies hypoglycemia.    ) An ACE inhibitor/angiotensin II receptor blocker is not being taken. He does not see a podiatrist.Eye exam is current.  Hyperlipidemia This is a chronic problem. The current episode  started more than 1 year ago. The problem is uncontrolled. Recent lipid tests were reviewed and are variable. Exacerbating diseases include chronic renal disease, diabetes, hypothyroidism and obesity. Factors aggravating his hyperlipidemia include fatty foods. Pertinent negatives include no chest pain, myalgias or shortness of breath. Current antihyperlipidemic treatment includes statins. The current treatment provides moderate improvement of lipids. There are no compliance problems.  Risk factors for coronary artery disease include diabetes mellitus, dyslipidemia, hypertension, male sex, obesity and a sedentary lifestyle.  Hypertension This is a chronic problem. The current episode started more than 1 year ago. The problem has been gradually improving since onset. The problem is controlled. Pertinent negatives include no chest pain, headaches, neck pain, palpitations or shortness of breath. Agents associated with hypertension include thyroid hormones and steroids. Risk factors for coronary artery disease include diabetes mellitus, dyslipidemia, male gender, obesity, smoking/tobacco exposure and sedentary lifestyle. Past treatments include nothing. There are no compliance problems.  Hypertensive end-organ damage includes kidney disease and CAD/MI.  Identifiable causes of hypertension include chronic renal disease and a thyroid problem.      Review of systems  Constitutional: + Minimally fluctuating body weight,  current  Body mass index is 33.89 kg/m.  no subjective hyperthermia, no subjective hypothermia     Objective:    BP 94/68   Pulse 72   Ht 5\' 11"  (1.803 m)   Wt 243 lb (110.2 kg)   BMI 33.89 kg/m   Wt Readings from Last 3 Encounters:  07/19/23 243 lb (110.2 kg)  03/09/23 244 lb 12.8 oz (111 kg)  12/01/22 249 lb (112.9 kg)    BP Readings from Last 3 Encounters:  07/19/23 94/68  03/09/23 96/68  12/01/22 102/64    Physical Exam- Limited  Constitutional:  Body mass index is  33.89 kg/m. , not in acute distress, normal state of mind    - Summary of his pituitary/sella MRI from 08/13/2016 as follows - IMPRESSION: 1. Transsphenoidal resection of previous pituitary tumor without evidence for residual or recurrent neoplasm. 2. Residual enhancing pituitary tissue is within normal limits. 3. Slight rightward deviation of the pituitary stalk likely related to scarring and previous surgery. 4. Slight downward deviation of the optic chiasm, also likely related to prior surgery. 5. The suprasellar brain is normal for age. 6. Extensive sinus disease as described.  Recent Results (from the past 2160 hour(s))  Testosterone, Free, Total, SHBG     Status: None   Collection Time: 07/12/23 10:58 AM  Result Value Ref Range   Testosterone 852 264 - 916 ng/dL    Comment: Adult male reference interval is based on a population of healthy nonobese males (BMI <30) between 50 and 74 years old. Travison, et.al. JCEM [phone]. PMID: [id number].    Testosterone, Free 13.5 7.2 - 24.0 pg/mL   Sex Hormone Binding 27.1 19.3 - 76.4 nmol/L  CBC with Differential/Platelet     Status: None   Collection Time: 07/12/23 10:58 AM  Result Value Ref Range   WBC 6.2 3.4 - 10.8 x10E3/uL   RBC 5.57 4.14 - 5.80 x10E6/uL   Hemoglobin 16.3 13.0 - 17.7 g/dL   Hematocrit 40.3 47.4 - 51.0 %   MCV 90 79 - 97 fL   MCH 29.3 26.6 - 33.0 pg   MCHC 32.4 31.5 - 35.7 g/dL   RDW 25.9 56.3 - 87.5 %   Platelets 151 150 - 450 x10E3/uL   Neutrophils 47 Not Estab. %   Lymphs 38 Not Estab. %   Monocytes 12 Not Estab. %   Eos 2 Not Estab. %   Basos 1 Not Estab. %   Neutrophils Absolute 2.9 1.4 - 7.0 x10E3/uL   Lymphocytes Absolute 2.4 0.7 - 3.1 x10E3/uL   Monocytes Absolute 0.7 0.1 - 0.9 x10E3/uL   EOS (ABSOLUTE) 0.1 0.0 - 0.4 x10E3/uL   Basophils Absolute 0.1 0.0 - 0.2 x10E3/uL   Immature Granulocytes 0 Not Estab. %   Immature Grans (Abs) 0.0 0.0 - 0.1 x10E3/uL  T4, free     Status: None    Collection Time: 07/12/23 10:58 AM  Result Value Ref Range   Free T4 1.47 0.82 - 1.77 ng/dL  HgB I4P     Status: Abnormal   Collection Time: 07/19/23 11:11 AM  Result Value Ref Range   Hemoglobin A1C     HbA1c POC (<> result, manual entry)     HbA1c, POC (prediabetic range)     HbA1c, POC (controlled diabetic range) 7.3 (A) 0.0 - 7.0 %  Lipid Panel     Component Value Date/Time   CHOL 130 03/02/2023 0930   TRIG 352 (H) 03/02/2023 0930   HDL 20 (L) 03/02/2023 0930   CHOLHDL 6.5 (H) 03/02/2023 0930   LDLCALC 56 03/02/2023 0930    Assessment & Plan:   1) Panhypopituitarism: R/t surgery for craniopharyngioma at approximate age of 18 years at [facility].    He has adrenal insufficiency, diabetes insipidus, hypothyroidism, and hypogonadism as a result.  His previsit labs show free T4 consistent with appropriate replacement.  He is advised to continue levothyroxine 112 mcg p.o. daily before breakfast.     - We discussed about the correct intake of his thyroid hormone, on empty stomach at fasting, with water, separated by at least 30 minutes from breakfast and other medications,  and separated by more than 4 hours from calcium, iron, multivitamins, acid reflux medications (PPIs). -Patient is made aware of the fact that thyroid hormone replacement is needed for life, dose to be adjusted by periodic monitoring of thyroid function tests.   He is also on hydrocortisone 10 mg p.o. twice daily, desmopressin 0.1 mg 3 times daily.  -His recent labs show that he has recovered from supraphysiologic testosterone complicated by erythrocytosis.    His previsit labs show total testosterone of 169.  He would benefit from slight increase in his testosterone dose.  I discussed and increase his testosterone to 50 mg IM every 10 days until next measurement.    It is essential to balance his target testosterone for safety reasons. His target level testosterone is 250-350 .  He will have  repeat labs in 3 months with office visit.   2. Type 2 diabetes with vascular complications  - Patient has currently controlled asymptomatic type 2 DM since  57 years of age.  He presents with a higher point-of-care A1c of 7.3% increasing from 6.1% during his last visit.  He denies hypoglycemia.       His diabetes is complicated by complicated by coronary artery disease status post triple bypass and mitral valve repair and patient remains at exceedingly high risk for more acute and chronic complications of diabetes  which include CAD, CVA, CKD, retinopathy, and neuropathy. These are all discussed in detail with the patient.  -  He acknowledges that there is a room for improvement in his food and drink choices. - Suggestion is made for him to avoid simple carbohydrates  from his diet including Cakes, Sweet Desserts, Ice Cream, Soda (diet and regular), Sweet Tea, Candies, Chips, Cookies, Store Bought Juices, Alcohol in Excess of  1-2 drinks a day, Artificial Sweeteners,  Coffee Creamer, and "Sugar-free" Products, Lemonade. This will help patient to have more stable blood glucose profile and potentially avoid unintended weight gain.     - Patient is advised to stick to a routine mealtimes to eat 3 meals  a day and avoid unnecessary snacks ( to snack only to correct hypoglycemia).  - I have approached patient with the following individualized plan to manage diabetes and patient agrees: -I discussed and increase his Trulicity through 4.5 mg subcutaneously weekly.  Side effects and precautions discussed with him.  He is also advised to continue Jardiance 10 mg p.o. daily at breakfast.  Side effects and precautions on Jardiance and Trulicity are discussed with him .  - Patient specific target  A1c;  LDL, HDL, Triglycerides, and  Waist Circumference were discussed in detail.  3) BP/HTN:   -His blood pressure is controlled to  target.  I see tomorrow he is not on any antihypertensive medications.    He will not tolerate blood pressure lowering medications at this time.  4) Lipids/HPL:  Recent lipid panel showed LDL controlled at 56.   He is advised to continue Lipitor 20 mg p.o. nightly.    Side effects and precautions discussed with him.     He is advised to avoid fried foods and butter.  5)  Weight/Diet:  His Body mass index is 33.89 kg/m.- Clearly complicating the care of his diabetes.  He is a candidate for modest weight loss.   CDE Consult has been requested, exercise, and detailed carbohydrates information provided.    - His repeat MRI of sella/pituitary in 2017- is consistent with postsurgical changes with no new lesion. Summaries dictated,  see above.   - He is status post sinus drainage by ENT with clinical improvement. He does not have any clinical complaints of mass-effect at this time.  6) Vitamin D deficiency: His most recent vitamin D level was 22.  He is advised to resume vitamin D3 2000 units daily.   7) Chronic Care/Health Maintenance:  -His BMI is 34.14 -a candidate for modest weight loss.   Patient on Statin medications and encouraged to continue to follow up with Ophthalmology, Podiatrist at least yearly or according to recommendations, and advised to   stay away from smoking. I have recommended yearly flu vaccine and pneumonia vaccination at least every 5 years; moderate intensity exercise for up to 150 minutes weekly; and  sleep for at least 7 hours a day.  - I advised patient to maintain close follow up with Alinda Deem, MD for primary care needs.   I spent  41  minutes in the care of the patient today including review of labs from CMP, Lipids, Thyroid Function, Hematology (current and previous including abstractions from other facilities); face-to-face time discussing  his blood glucose readings/logs, discussing hypoglycemia and hyperglycemia episodes and symptoms, medications doses, his options of short and long term treatment based on the latest  standards of care / guidelines;  discussion about incorporating lifestyle medicine;  and documenting the encounter. Risk reduction counseling performed per USPSTF guidelines to reduce  obesity and cardiovascular risk factors.     Please refer to Patient Instructions for Blood Glucose Monitoring and Insulin/Medications Dosing Guide"  in media tab for additional information. Please  also refer to " Patient Self Inventory" in the Media  tab for reviewed elements of pertinent patient history.  Oswaldo Done participated in the discussions, expressed understanding, and voiced agreement with the above plans.  All questions were answered to his satisfaction. he is encouraged to contact clinic should he have any questions or concerns prior to his return visit.   Follow up plan: - Return in about 4 months (around 11/16/2023) for Fasting Labs  in AM B4 8, A1c -NV.  Ronny Bacon, [facility] [address] [place], [place] [address] Phone: [phone] Fax: [phone] 07/19/2023, 2:15 PM

## 2023-07-23 ENCOUNTER — Other Ambulatory Visit: Payer: Self-pay | Admitting: "Endocrinology

## 2023-08-10 ENCOUNTER — Other Ambulatory Visit: Payer: Self-pay | Admitting: "Endocrinology

## 2023-08-19 ENCOUNTER — Other Ambulatory Visit: Payer: Self-pay | Admitting: "Endocrinology

## 2023-09-15 ENCOUNTER — Other Ambulatory Visit: Payer: Self-pay | Admitting: "Endocrinology

## 2023-10-13 ENCOUNTER — Other Ambulatory Visit: Payer: Self-pay | Admitting: "Endocrinology

## 2023-11-09 ENCOUNTER — Other Ambulatory Visit: Payer: Self-pay | Admitting: "Endocrinology

## 2023-11-12 LAB — CBC WITH DIFFERENTIAL/PLATELET
Basophils Absolute: 0.1 10*3/uL (ref 0.0–0.2)
Basos: 1 %
EOS (ABSOLUTE): 0.1 10*3/uL (ref 0.0–0.4)
Eos: 2 %
Hematocrit: 51.1 % — ABNORMAL HIGH (ref 37.5–51.0)
Hemoglobin: 17.2 g/dL (ref 13.0–17.7)
Immature Grans (Abs): 0 10*3/uL (ref 0.0–0.1)
Immature Granulocytes: 0 %
Lymphocytes Absolute: 2.8 10*3/uL (ref 0.7–3.1)
Lymphs: 41 %
MCH: 30.1 pg (ref 26.6–33.0)
MCHC: 33.7 g/dL (ref 31.5–35.7)
MCV: 89 fL (ref 79–97)
Monocytes Absolute: 0.6 10*3/uL (ref 0.1–0.9)
Monocytes: 9 %
Neutrophils Absolute: 3.2 10*3/uL (ref 1.4–7.0)
Neutrophils: 47 %
Platelets: 112 10*3/uL — ABNORMAL LOW (ref 150–450)
RBC: 5.72 x10E6/uL (ref 4.14–5.80)
RDW: 12.5 % (ref 11.6–15.4)
WBC: 6.8 10*3/uL (ref 3.4–10.8)

## 2023-11-12 LAB — LIPID PANEL
Chol/HDL Ratio: 5.4 ratio — ABNORMAL HIGH (ref 0.0–5.0)
Cholesterol, Total: 118 mg/dL (ref 100–199)
HDL: 22 mg/dL — ABNORMAL LOW (ref >39)
LDL Chol Calc (NIH): 36 mg/dL (ref 0–99)
Triglycerides: 418 mg/dL — ABNORMAL HIGH (ref 0–149)
VLDL Cholesterol Cal: 60 mg/dL — ABNORMAL HIGH (ref 5–40)

## 2023-11-12 LAB — COMPREHENSIVE METABOLIC PANEL
ALT: 24 IU/L (ref 0–44)
AST: 18 IU/L (ref 0–40)
Albumin: 4.1 g/dL (ref 3.8–4.9)
Alkaline Phosphatase: 113 IU/L (ref 44–121)
BUN/Creatinine Ratio: 13 (ref 9–20)
BUN: 17 mg/dL (ref 6–24)
Bilirubin Total: 0.4 mg/dL (ref 0.0–1.2)
CO2: 19 mmol/L — ABNORMAL LOW (ref 20–29)
Calcium: 9 mg/dL (ref 8.7–10.2)
Chloride: 104 mmol/L (ref 96–106)
Creatinine, Ser: 1.35 mg/dL — ABNORMAL HIGH (ref 0.76–1.27)
Globulin, Total: 2.5 g/dL (ref 1.5–4.5)
Glucose: 215 mg/dL — ABNORMAL HIGH (ref 70–99)
Potassium: 4.5 mmol/L (ref 3.5–5.2)
Sodium: 138 mmol/L (ref 134–144)
Total Protein: 6.6 g/dL (ref 6.0–8.5)
eGFR: 61 mL/min/{1.73_m2} (ref >59)

## 2023-11-12 LAB — VITAMIN D 25 HYDROXY (VIT D DEFICIENCY, FRACTURES): Vit D, 25-Hydroxy: 15.4 ng/mL — ABNORMAL LOW (ref 30.0–100.0)

## 2023-11-12 LAB — T4, FREE: Free T4: 1.42 ng/dL (ref 0.82–1.77)

## 2023-11-12 LAB — PSA: Prostate Specific Ag, Serum: 0.4 ng/mL (ref 0.0–4.0)

## 2023-11-12 LAB — TESTOSTERONE, FREE, TOTAL, SHBG
Sex Hormone Binding: 26 nmol/L (ref 19.3–76.4)
Testosterone, Free: 2.3 pg/mL — ABNORMAL LOW (ref 7.2–24.0)
Testosterone: 290 ng/dL (ref 264–916)

## 2023-11-14 ENCOUNTER — Other Ambulatory Visit: Payer: Self-pay | Admitting: "Endocrinology

## 2023-11-16 ENCOUNTER — Ambulatory Visit (INDEPENDENT_AMBULATORY_CARE_PROVIDER_SITE_OTHER): Payer: No Typology Code available for payment source | Admitting: "Endocrinology

## 2023-11-16 ENCOUNTER — Encounter: Payer: Self-pay | Admitting: "Endocrinology

## 2023-11-16 VITALS — BP 104/70 | HR 60 | Ht 71.0 in | Wt 247.6 lb

## 2023-11-16 DIAGNOSIS — E232 Diabetes insipidus: Secondary | ICD-10-CM

## 2023-11-16 DIAGNOSIS — Z7984 Long term (current) use of oral hypoglycemic drugs: Secondary | ICD-10-CM

## 2023-11-16 DIAGNOSIS — E1159 Type 2 diabetes mellitus with other circulatory complications: Secondary | ICD-10-CM | POA: Diagnosis not present

## 2023-11-16 DIAGNOSIS — E890 Postprocedural hypothyroidism: Secondary | ICD-10-CM | POA: Diagnosis not present

## 2023-11-16 DIAGNOSIS — Z7985 Long-term (current) use of injectable non-insulin antidiabetic drugs: Secondary | ICD-10-CM

## 2023-11-16 DIAGNOSIS — E291 Testicular hypofunction: Secondary | ICD-10-CM

## 2023-11-16 DIAGNOSIS — E230 Hypopituitarism: Secondary | ICD-10-CM

## 2023-11-16 DIAGNOSIS — E559 Vitamin D deficiency, unspecified: Secondary | ICD-10-CM

## 2023-11-16 LAB — POCT GLYCOSYLATED HEMOGLOBIN (HGB A1C): HbA1c, POC (controlled diabetic range): 7.4 % — AB (ref 0.0–7.0)

## 2023-11-16 MED ORDER — EMPAGLIFLOZIN 25 MG PO TABS: 25.0000 mg | ORAL_TABLET | Freq: Every day | ORAL | 1 refills | Status: DC

## 2023-11-16 MED ORDER — CVS D3 125 MCG (5000 UT) PO CAPS: 5000.0000 [IU] | ORAL_CAPSULE | Freq: Every day | ORAL | 1 refills | Status: AC

## 2023-11-16 NOTE — Patient Instructions (Signed)

## 2023-11-16 NOTE — Progress Notes (Signed)
 11/16/2023   Endocrinology follow-up note    Subjective:    Patient ID: Ernest Graham, male    DOB: 12-27-1965.   He is returning for follow-up for multiple endocrine conditions as follows.    -He is on regular follow-up in this clinic for  multiple medical problems including type 2 diabetes, panhypopituitarism: Hypothyroidism, diabetes insipidus, hypogonadism .  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  He is on multiple hormone replacements including levothyroxine, hydrocortisone, desmopressin, testosterone.      He is  also being treated for type 2 diabetes with Jardiance and Trulicity.   Due to national shortage, he was without his Trulicity for more than 4 weeks.  His recent A1c was 8% increasing from 6.1%.      He has slightly fluctuating body weight.  He admits that he has room to improve his dietary habits.    He denies heat or cold intolerance. He denies polyuria, nocturia, nor recent hospitalizations.  He has no new complaints today.   Past Medical History:  Diagnosis Date   Adrenal disease (HCC)    Diabetes mellitus, type II (HCC)    Heart attack (HCC)    Hyperlipidemia    Past Surgical History:  Procedure Laterality Date   CHOLECYSTECTOMY  01/2023   CORONARY ARTERY BYPASS GRAFT     MITRAL VALVE REPAIR     Removal of Cricopharyngeoma     Social History   Socioeconomic History   Marital status: Married    Spouse name: Not on file   Number of children: Not on file   Years of education: Not on file   Highest education level: Not on file  Occupational History   Not on file  Tobacco Use   Smoking status: Former   Smokeless tobacco: Never  Vaping Use   Vaping status: Never Used  Substance and Sexual Activity   Alcohol use: No    Alcohol/week: 0.0 standard drinks of alcohol   Drug use: No   Sexual activity: Not on file  Other Topics Concern   Not on file  Social History Narrative   Not on  file   Social Drivers of Health   Financial Resource Strain: Not on file  Food Insecurity: Not on file  Transportation Needs: Not on file  Physical Activity: Not on file  Stress: Not on file  Social Connections: Not on file   Outpatient Encounter Medications as of 11/16/2023  Medication Sig   aspirin 81 MG EC tablet Take 81 mg by mouth daily. Swallow whole.   atorvastatin (LIPITOR) 20 MG tablet TAKE 1 TABLET BY MOUTH EVERY DAY (Patient taking differently: Take 40 mg by mouth daily.)   B-D LUER-LOK SYRINGE 20G X 1" 1 ML MISC USE TO INJECT TESTOSTERONE EVERY WEEK   busPIRone (BUSPAR) 15 MG tablet Take 15 mg by mouth 2 (two) times daily.   Cholecalciferol ([employer] D3) 125 MCG (5000 UT) capsule Take 1 capsule (5,000 Units total) by mouth daily.   clonazePAM (KLONOPIN) 0.5 MG tablet Take 0.5 mg by mouth 3 (three) times daily as needed for anxiety.   desmopressin (DDAVP) 0.1 MG tablet TAKE 1 TABLET (0.1 MG TOTAL) BY MOUTH IN THE MORNING, AT NOON, AND AT BEDTIME.   Dulaglutide (TRULICITY) 4.5 MG/0.5ML SOAJ Inject 4.5 mg as directed once a week.   empagliflozin (JARDIANCE) 25 MG TABS tablet Take 1 tablet (25 mg total) by mouth daily.   FLUoxetine (PROZAC) 20 MG tablet Take 20  mg by mouth daily.   hydrocortisone (CORTEF) 5 MG tablet TAKE 2 TABLETS BY MOUTH TWICE A DAY   icosapent Ethyl (VASCEPA) 1 g capsule Take 2 g by mouth 2 (two) times daily.   levothyroxine (SYNTHROID) 112 MCG tablet TAKE 1 TABLET BY MOUTH EVERY DAY BEFORE BREAKFAST   metoprolol tartrate (LOPRESSOR) 50 MG tablet Take 50 mg by mouth 2 (two) times daily.   potassium chloride SA (K-DUR,KLOR-CON) 20 MEQ tablet Take 20 mEq by mouth daily.   SYRINGE-NEEDLE, DISP, 3 ML (B-D 3CC LUER-LOK SYR 20GX1-1/2) 20G X 1-1/2" 3 ML MISC USE TO INJECT TESTOSTERONE EVERY WEEK   testosterone cypionate (DEPOTESTOSTERONE CYPIONATE) 200 MG/ML injection INJECT 50MG  (0.25ML) INTRAMUSCULARLY EVERY 10 DAYS   [DISCONTINUED] CVS D3 125 MCG (5000 UT) capsule TAKE  1 CAPSULE BY MOUTH EVERY DAY (Patient not taking: Reported on 12/01/2022)   [DISCONTINUED] JARDIANCE 10 MG TABS tablet TAKE 1 TABLET BY MOUTH EVERY DAY BEFORE BREAKFAST   [DISCONTINUED] metoprolol tartrate (LOPRESSOR) 25 MG tablet Take 25 mg by mouth 2 (two) times daily.   No facility-administered encounter medications on file as of 11/16/2023.   ALLERGIES: No Known Allergies VACCINATION STATUS: Immunization History  Administered Date(s) Administered   PFIZER(Purple Top)SARS-COV-2 Vaccination 11/20/2019, 12/11/2019, 05/30/2020     He does not have acute complaints today.  Diabetes He presents for his follow-up diabetic visit. He has type 2 diabetes mellitus. Onset time: He was diagnosed at approximate age of 16 years. His disease course has been stable. There are no hypoglycemic associated symptoms. Pertinent negatives for hypoglycemia include no confusion, headaches, pallor or seizures. Pertinent negatives for diabetes include no chest pain, no fatigue, no polydipsia, no polyphagia, no polyuria and no weakness. There are no hypoglycemic complications. Symptoms are stable. There are no diabetic complications. (Triple bypass and mitral valve repair.) Risk factors for coronary artery disease include diabetes mellitus, dyslipidemia, hypertension, obesity, tobacco exposure, sedentary lifestyle and male sex. Current diabetic treatment includes oral agent (monotherapy). He is compliant with treatment most of the time. His weight is fluctuating minimally. He is following a generally unhealthy diet. When asked about meal planning, he reported none. He has not had a previous visit with a dietitian. He never participates in exercise. His home blood glucose trend is fluctuating minimally. (He presents with a higher point-of-care A1c of 7.4% increasing from 6.1% during his last visit.  He denies hypoglycemia.    ) An ACE inhibitor/angiotensin II receptor blocker is not being taken. He does not see a  podiatrist.Eye exam is current.  Hyperlipidemia This is a chronic problem. The current episode started more than 1 year ago. The problem is uncontrolled. Recent lipid tests were reviewed and are variable. Exacerbating diseases include chronic renal disease, diabetes, hypothyroidism and obesity. Factors aggravating his hyperlipidemia include fatty foods. Pertinent negatives include no chest pain, myalgias or shortness of breath. Current antihyperlipidemic treatment includes statins. The current treatment provides moderate improvement of lipids. There are no compliance problems.  Risk factors for coronary artery disease include diabetes mellitus, dyslipidemia, hypertension, male sex, obesity and a sedentary lifestyle.  Hypertension This is a chronic problem. The current episode started more than 1 year ago. The problem has been gradually improving since onset. The problem is controlled. Pertinent negatives include no chest pain, headaches, neck pain, palpitations or shortness of breath. Agents associated with hypertension include thyroid hormones and steroids. Risk factors for coronary artery disease include diabetes mellitus, dyslipidemia, male gender, obesity, smoking/tobacco exposure and sedentary lifestyle. Past treatments include  nothing. There are no compliance problems.  Hypertensive end-organ damage includes kidney disease and CAD/MI. Identifiable causes of hypertension include chronic renal disease and a thyroid problem.    Review of systems  Constitutional: + Minimally fluctuating body weight,  current  Body mass index is 34.53 kg/m.  no subjective hyperthermia, no subjective hypothermia   Objective:    BP 104/70   Pulse 60   Ht 5\' 11"  (1.803 m)   Wt 247 lb 9.6 oz (112.3 kg)   BMI 34.53 kg/m   Wt Readings from Last 3 Encounters:  11/16/23 247 lb 9.6 oz (112.3 kg)  07/19/23 243 lb (110.2 kg)  03/09/23 244 lb 12.8 oz (111 kg)    BP Readings from Last 3 Encounters:  11/16/23 104/70   07/19/23 94/68  03/09/23 96/68    Physical Exam- Limited  Constitutional:  Body mass index is 34.53 kg/m. , not in acute distress, normal state of mind    - Summary of his pituitary/sella MRI from 08/13/2016 as follows - IMPRESSION: 1. Transsphenoidal resection of previous pituitary tumor without evidence for residual or recurrent neoplasm. 2. Residual enhancing pituitary tissue is within normal limits. 3. Slight rightward deviation of the pituitary stalk likely related to scarring and previous surgery. 4. Slight downward deviation of the optic chiasm, also likely related to prior surgery. 5. The suprasellar brain is normal for age. 6. Extensive sinus disease as described.  Recent Results (from the past 2160 hours)  Comprehensive metabolic panel     Status: Abnormal   Collection Time: 11/08/23  8:56 AM  Result Value Ref Range   Glucose 215 (H) 70 - 99 mg/dL   BUN 17 6 - 24 mg/dL   Creatinine, Ser 1.61 (H) 0.76 - 1.27 mg/dL   eGFR 61 >09 UE/AVW/0.98   BUN/Creatinine Ratio 13 9 - 20   Sodium 138 134 - 144 mmol/L   Potassium 4.5 3.5 - 5.2 mmol/L   Chloride 104 96 - 106 mmol/L   CO2 19 (L) 20 - 29 mmol/L   Calcium 9.0 8.7 - 10.2 mg/dL   Total Protein 6.6 6.0 - 8.5 g/dL   Albumin 4.1 3.8 - 4.9 g/dL   Globulin, Total 2.5 1.5 - 4.5 g/dL   Bilirubin Total 0.4 0.0 - 1.2 mg/dL   Alkaline Phosphatase 113 44 - 121 IU/L   AST 18 0 - 40 IU/L   ALT 24 0 - 44 IU/L  Lipid panel     Status: Abnormal   Collection Time: 11/08/23  8:56 AM  Result Value Ref Range   Cholesterol, Total 118 100 - 199 mg/dL   Triglycerides 119 (H) 0 - 149 mg/dL   HDL 22 (L) >14 mg/dL   VLDL Cholesterol Cal 60 (H) 5 - 40 mg/dL   LDL Chol Calc (NIH) 36 0 - 99 mg/dL   Chol/HDL Ratio 5.4 (H) 0.0 - 5.0 ratio    Comment:                                   T. Chol/HDL Ratio                                             Men  Women  1/2 Avg.Risk  3.4    3.3                                    Avg.Risk  5.0    4.4                                2X Avg.Risk  9.6    7.1                                3X Avg.Risk 23.4   11.0   T4, free     Status: None   Collection Time: 11/08/23  8:56 AM  Result Value Ref Range   Free T4 1.42 0.82 - 1.77 ng/dL  VITAMIN D 25 Hydroxy (Vit-D Deficiency, Fractures)     Status: Abnormal   Collection Time: 11/08/23  8:56 AM  Result Value Ref Range   Vit D, 25-Hydroxy 15.4 (L) 30.0 - 100.0 ng/mL    Comment: Vitamin D deficiency has been defined by the Institute of Medicine and an Endocrine Society practice guideline as a level of serum 25-OH vitamin D less than 20 ng/mL (1,2). The Endocrine Society went on to further define vitamin D insufficiency as a level between 21 and 29 ng/mL (2). 1. IOM (Institute of Medicine). 2010. Dietary reference    intakes for calcium and D. [place]: The    [employer]. 2. Holick MF, Binkley [place], Bischoff-Ferrari HA, et al.    Evaluation, treatment, and prevention of vitamin D    deficiency: an Endocrine Society clinical practice    guideline. JCEM. 2011 Jul; 96(7):1911-30.   Testosterone, Free, Total, SHBG     Status: Abnormal   Collection Time: 11/08/23  8:56 AM  Result Value Ref Range   Testosterone 290 264 - 916 ng/dL    Comment: Adult male reference interval is based on a population of healthy nonobese males (BMI <30) between 57 and 40 years old. Travison, et.al. JCEM 5208812314. PMID: 25366440.    Testosterone, Free 2.3 (L) 7.2 - 24.0 pg/mL   Sex Hormone Binding 26.0 19.3 - 76.4 nmol/L  PSA     Status: None   Collection Time: 11/08/23  8:56 AM  Result Value Ref Range   Prostate Specific Ag, Serum 0.4 0.0 - 4.0 ng/mL    Comment: Roche ECLIA methodology. According to the American Urological Association, Serum PSA should decrease and remain at undetectable levels after radical prostatectomy. The AUA defines biochemical recurrence as an initial PSA value 0.2 ng/mL or greater  followed by a subsequent confirmatory PSA value 0.2 ng/mL or greater. Values obtained with different assay methods or kits cannot be used interchangeably. Results cannot be interpreted as absolute evidence of the presence or absence of malignant disease.   CBC with Differential/Platelet     Status: Abnormal   Collection Time: 11/08/23  8:56 AM  Result Value Ref Range   WBC 6.8 3.4 - 10.8 x10E3/uL   RBC 5.72 4.14 - 5.80 x10E6/uL   Hemoglobin 17.2 13.0 - 17.7 g/dL   Hematocrit 34.7 (H) 42.5 - 51.0 %   MCV 89 79 - 97 fL   MCH 30.1 26.6 - 33.0 pg   MCHC 33.7 31.5 - 35.7 g/dL   RDW 95.6 38.7 - 56.4 %   Platelets 112 (L)  150 - 450 x10E3/uL   Neutrophils 47 Not Estab. %   Lymphs 41 Not Estab. %   Monocytes 9 Not Estab. %   Eos 2 Not Estab. %   Basos 1 Not Estab. %   Neutrophils Absolute 3.2 1.4 - 7.0 x10E3/uL   Lymphocytes Absolute 2.8 0.7 - 3.1 x10E3/uL   Monocytes Absolute 0.6 0.1 - 0.9 x10E3/uL   EOS (ABSOLUTE) 0.1 0.0 - 0.4 x10E3/uL   Basophils Absolute 0.1 0.0 - 0.2 x10E3/uL   Immature Granulocytes 0 Not Estab. 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    He has adrenal insufficiency, diabetes insipidus, hypothyroidism, and hypogonadism as a result.  His previsit labs show free T4 consistent with appropriate replacement.  He is advised to continue levothyroxine 112 mcg p.o. daily before  breakfast.    - We discussed about the correct intake of his thyroid hormone, on empty stomach at fasting, with water, separated by at least 30 minutes from breakfast and other medications,  and separated by more than 4 hours from calcium, iron, multivitamins, acid reflux medications (PPIs). -Patient is made aware of the fact that thyroid hormone replacement is needed for life, dose to be adjusted by periodic monitoring of thyroid function tests.   He is also on hydrocortisone 10 mg p.o. twice daily, desmopressin 0.1 mg 3 times daily.  -His recent labs show that he has recovered from supraphysiologic testosterone complicated by erythrocytosis.    His previsit labs show total testosterone of 290 on the last day of his injection cycle-considered acceptable.  He is advised to continue testosterone  50 mg IM every 10 days until next measurement.    It is essential to balance his target testosterone for safety reasons. His target level testosterone is 250-350 .  He will have repeat labs in 3 months with office visit.   2. Type 2 diabetes with vascular complications  - Patient has currently controlled asymptomatic type 2 DM since  58 years of age.  He presents with a higher point-of-care A1c of 7.4% increasing from 6.1% during his last visit.  He denies hypoglycemia.       His diabetes is complicated by complicated by coronary artery disease status post triple bypass and mitral valve repair and patient remains at exceedingly high risk for more acute and chronic complications of diabetes  which include CAD, CVA, CKD, retinopathy, and neuropathy. These are all discussed in detail with the patient.  -  He acknowledges that there is a room for improvement in his food and drink choices. - Suggestion is made for him to avoid simple carbohydrates  from his diet including Cakes, Sweet Desserts, Ice Cream, Soda (diet and regular), Sweet Tea, Candies, Chips, Cookies, Store Bought Juices, Alcohol in  Excess  of  1-2 drinks a day, Artificial Sweeteners,  Coffee Creamer, and "Sugar-free" Products, Lemonade. This will help patient to have more stable blood glucose profile and potentially avoid unintended weight gain.     - Patient is advised to stick to a routine mealtimes to eat 3 meals  a day and avoid unnecessary snacks ( to snack only to correct hypoglycemia).  - I have approached patient with the following individualized plan to manage diabetes and patient agrees: -I advised him to continue Trulicity 4.5 mg subcutaneously weekly.  I discussed and increase his Jardiance to 25 mg p.o. daily at breakfast.  Side effects and precautions discussed with him.   - Patient specific target  A1c;  LDL, HDL, Triglycerides, and  Waist Circumference were discussed in detail.  3) BP/HTN:   -His blood pressure is controlled to target.  He is on metoprolol 50 mg p.o. twice daily.     He will not tolerate any more blood pressure lowering medications at this time.  4) Lipids/HPL:  Recent lipid panel showed LDL controlled at 36.   He is advised to continue Lipitor 20 mg p.o. nightly.      Side effects and precautions discussed with him.     He is advised to avoid fried foods and butter.  5)  Weight/Diet:  His Body mass index is 34.53 kg/m.- Clearly complicating the care of his diabetes.  He is a candidate for modest weight loss.   CDE Consult has been requested, exercise, and detailed carbohydrates information provided.    - His repeat MRI of sella/pituitary in 2017- is consistent with postsurgical changes with no new lesion. Summaries dictated,  see above.   - He is status post sinus drainage by ENT with clinical improvement. He does not have any clinical complaints of mass-effect at this time.  6) Vitamin D deficiency: His most recent vitamin D level was 14.  He is advised to resume and continue vitamin D3 2000 units daily.     7) Chronic Care/Health Maintenance:  -His BMI is 34.53- -a candidate for  modest weight loss.   Patient on Statin medications and encouraged to continue to follow up with Ophthalmology, Podiatrist at least yearly or according to recommendations, and advised to   stay away from smoking. I have recommended yearly flu vaccine and pneumonia vaccination at least every 5 years; moderate intensity exercise for up to 150 minutes weekly; and  sleep for at least 7 hours a day.  - I advised patient to maintain close follow up with Alinda Deem, MD for primary care needs.   I spent  41  minutes in the care of the patient today including review of labs from CMP, Lipids, Thyroid Function, Hematology (current and previous including abstractions from other facilities); face-to-face time discussing  his blood glucose readings/logs, discussing hypoglycemia and hyperglycemia episodes and symptoms, medications doses, his options of short and long term treatment based on the latest standards of care / guidelines;  discussion about incorporating lifestyle medicine;  and documenting the encounter. Risk reduction counseling performed per USPSTF guidelines to reduce  obesity and cardiovascular risk factors.     Please refer to Patient Instructions for Blood Glucose Monitoring and Insulin/Medications Dosing Guide"  in media tab for additional information. Please  also refer to " Patient Self Inventory" in the Media  tab for reviewed elements of pertinent patient history.  Ernest Graham participated in the discussions, expressed understanding, and voiced agreement with the above plans.  All questions were answered to his satisfaction. he is encouraged to contact clinic should he have any questions or concerns prior to his return visit.    Follow up plan: - Return in about 4 months (around 03/17/2024) for Fasting Labs  in AM B4 8, A1c -NV.  Ronny Bacon, [facility] [address] [place], [place] [address] Phone: [phone] Fax: [phone] 11/16/2023,  5:22 PM

## 2023-11-17 ENCOUNTER — Other Ambulatory Visit: Payer: Self-pay | Admitting: "Endocrinology

## 2023-12-02 ENCOUNTER — Telehealth: Payer: Self-pay

## 2023-12-02 NOTE — Telephone Encounter (Signed)
 Left a message requesting pt return call to the office.

## 2023-12-13 ENCOUNTER — Other Ambulatory Visit: Payer: Self-pay | Admitting: "Endocrinology

## 2023-12-13 HISTORY — PX: NASAL SINUS SURGERY: SHX719

## 2024-02-14 ENCOUNTER — Other Ambulatory Visit: Payer: Self-pay | Admitting: "Endocrinology

## 2024-03-13 LAB — T4, FREE: Free T4: 1.38 ng/dL (ref 0.82–1.77)

## 2024-03-13 LAB — CBC WITH DIFFERENTIAL/PLATELET
Basophils Absolute: 0.1 10*3/uL (ref 0.0–0.2)
Basos: 1 %
EOS (ABSOLUTE): 0.1 10*3/uL (ref 0.0–0.4)
Eos: 2 %
Hematocrit: 52.4 % — ABNORMAL HIGH (ref 37.5–51.0)
Hemoglobin: 16.9 g/dL (ref 13.0–17.7)
Immature Grans (Abs): 0 10*3/uL (ref 0.0–0.1)
Immature Granulocytes: 0 %
Lymphocytes Absolute: 2.9 10*3/uL (ref 0.7–3.1)
Lymphs: 36 %
MCH: 30 pg (ref 26.6–33.0)
MCHC: 32.3 g/dL (ref 31.5–35.7)
MCV: 93 fL (ref 79–97)
Monocytes Absolute: 0.6 10*3/uL (ref 0.1–0.9)
Monocytes: 8 %
Neutrophils Absolute: 4.3 10*3/uL (ref 1.4–7.0)
Neutrophils: 53 %
Platelets: 142 10*3/uL — ABNORMAL LOW (ref 150–450)
RBC: 5.64 x10E6/uL (ref 4.14–5.80)
RDW: 12.2 % (ref 11.6–15.4)
WBC: 8.1 10*3/uL (ref 3.4–10.8)

## 2024-03-13 LAB — COMPREHENSIVE METABOLIC PANEL WITH GFR
ALT: 21 IU/L (ref 0–44)
AST: 16 IU/L (ref 0–40)
Albumin: 4.5 g/dL (ref 3.8–4.9)
Alkaline Phosphatase: 189 IU/L — ABNORMAL HIGH (ref 44–121)
BUN/Creatinine Ratio: 11 (ref 9–20)
BUN: 17 mg/dL (ref 6–24)
Bilirubin Total: 0.4 mg/dL (ref 0.0–1.2)
CO2: 17 mmol/L — ABNORMAL LOW (ref 20–29)
Calcium: 9.6 mg/dL (ref 8.7–10.2)
Chloride: 97 mmol/L (ref 96–106)
Creatinine, Ser: 1.49 mg/dL — ABNORMAL HIGH (ref 0.76–1.27)
Globulin, Total: 2.7 g/dL (ref 1.5–4.5)
Glucose: 438 mg/dL — ABNORMAL HIGH (ref 70–99)
Potassium: 4.4 mmol/L (ref 3.5–5.2)
Sodium: 133 mmol/L — ABNORMAL LOW (ref 134–144)
Total Protein: 7.2 g/dL (ref 6.0–8.5)
eGFR: 54 mL/min/{1.73_m2} — ABNORMAL LOW (ref >59)

## 2024-03-13 LAB — TESTOSTERONE, FREE, TOTAL, SHBG
Sex Hormone Binding: 25.9 nmol/L (ref 19.3–76.4)
Testosterone, Free: 5.4 pg/mL — ABNORMAL LOW (ref 7.2–24.0)
Testosterone: 347 ng/dL (ref 264–916)

## 2024-03-14 ENCOUNTER — Other Ambulatory Visit: Payer: Self-pay | Admitting: "Endocrinology

## 2024-03-20 ENCOUNTER — Telehealth: Payer: Self-pay

## 2024-03-20 ENCOUNTER — Ambulatory Visit (INDEPENDENT_AMBULATORY_CARE_PROVIDER_SITE_OTHER): Admitting: "Endocrinology

## 2024-03-20 ENCOUNTER — Other Ambulatory Visit (HOSPITAL_COMMUNITY): Payer: Self-pay

## 2024-03-20 ENCOUNTER — Encounter: Payer: Self-pay | Admitting: "Endocrinology

## 2024-03-20 VITALS — BP 100/76 | HR 56 | Ht 71.0 in | Wt 246.6 lb

## 2024-03-20 DIAGNOSIS — E559 Vitamin D deficiency, unspecified: Secondary | ICD-10-CM

## 2024-03-20 DIAGNOSIS — E890 Postprocedural hypothyroidism: Secondary | ICD-10-CM | POA: Diagnosis not present

## 2024-03-20 DIAGNOSIS — E1159 Type 2 diabetes mellitus with other circulatory complications: Secondary | ICD-10-CM

## 2024-03-20 DIAGNOSIS — E291 Testicular hypofunction: Secondary | ICD-10-CM

## 2024-03-20 DIAGNOSIS — E232 Diabetes insipidus: Secondary | ICD-10-CM

## 2024-03-20 DIAGNOSIS — E230 Hypopituitarism: Secondary | ICD-10-CM | POA: Diagnosis not present

## 2024-03-20 DIAGNOSIS — Z7984 Long term (current) use of oral hypoglycemic drugs: Secondary | ICD-10-CM | POA: Diagnosis not present

## 2024-03-20 LAB — POCT GLYCOSYLATED HEMOGLOBIN (HGB A1C): HbA1c, POC (controlled diabetic range): 10.1 % — AB (ref 0.0–7.0)

## 2024-03-20 MED ORDER — TRULICITY 3 MG/0.5ML ~~LOC~~ SOAJ
3.0000 mg | SUBCUTANEOUS | 0 refills | Status: DC
Start: 2024-03-20 — End: 2024-04-17

## 2024-03-20 MED ORDER — METFORMIN HCL 500 MG PO TABS: 500.0000 mg | ORAL_TABLET | Freq: Two times a day (BID) | ORAL | 1 refills | Status: DC

## 2024-03-20 NOTE — Telephone Encounter (Signed)
 You're correct, it is 3mg  weekly. Pt has been off of his medication for the past 3 weeks.

## 2024-03-20 NOTE — Progress Notes (Signed)
 03/20/2024   Endocrinology follow-up note    Subjective:    Patient ID: Ernest Graham, male    DOB: 1966-03-08.    -He is on regular follow-up in this clinic for  multiple medical problems including type 2 diabetes, panhypopituitarism: Hypothyroidism, diabetes insipidus, hypogonadism .  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  He is on multiple hormone replacements including levothyroxine , hydrocortisone , desmopressin , testosterone .      He is  also being treated for type 2 diabetes, was supposed to be on Jardiance  25 mg p.o. daily and Trulicity  4.5 subcutaneously weekly.  For unclear reasons, he did not fill his Trulicity  for the last 3 weeks and only continued on his Jardiance .    He developed symptoms including polyuria polydipsia, did not monitor blood glucose.  He is point-of-care A1c today was found to be 10.1% increasing from 7.4% during his last visit.       He has slightly fluctuating body weight.  He admits that he has room to improve his dietary habits.    He denies heat or cold intolerance. He denies polyuria, nocturia, nor recent hospitalizations.  He has no new complaints today.   Past Medical History:  Diagnosis Date   Adrenal disease (HCC)    Diabetes mellitus, type II (HCC)    Heart attack (HCC)    Hyperlipidemia    Past Surgical History:  Procedure Laterality Date   CHOLECYSTECTOMY  01/2023   CORONARY ARTERY BYPASS GRAFT     MITRAL VALVE REPAIR     NASAL SINUS SURGERY  12/2023   Removal of Cricopharyngeoma     Social History   Socioeconomic History   Marital status: Married    Spouse name: Not on file   Number of children: Not on file   Years of education: Not on file   Highest education level: Not on file  Occupational History   Not on file  Tobacco Use   Smoking status: Former   Smokeless tobacco: Never  Vaping Use   Vaping status: Never Used  Substance and Sexual Activity    Alcohol use: No    Alcohol/week: 0.0 standard drinks of alcohol   Drug use: No   Sexual activity: Not on file  Other Topics Concern   Not on file  Social History Narrative   Not on file   Social Drivers of Health   Financial Resource Strain: Not on file  Food Insecurity: Not on file  Transportation Needs: Not on file  Physical Activity: Not on file  Stress: Not on file  Social Connections: Not on file   Outpatient Encounter Medications as of 03/20/2024  Medication Sig   Dulaglutide  (TRULICITY ) 3 MG/0.5ML SOAJ Inject 3 mg as directed once a week.   metFORMIN  (GLUCOPHAGE ) 500 MG tablet Take 1 tablet (500 mg total) by mouth 2 (two) times daily with a meal.   aspirin 81 MG EC tablet Take 81 mg by mouth daily. Swallow whole.   B-D LUER-LOK SYRINGE 20G X 1 1 ML MISC USE TO INJECT TESTOSTERONE  EVERY WEEK   busPIRone (BUSPAR) 15 MG tablet Take 15 mg by mouth 2 (two) times daily.   Cholecalciferol  ([employer] D3) 125 MCG (5000 UT) capsule Take 1 capsule (5,000 Units total) by mouth daily.   clonazePAM (KLONOPIN) 0.5 MG tablet Take 0.5 mg by mouth 3 (three) times daily as needed for anxiety.   desmopressin  (DDAVP ) 0.1 MG tablet TAKE 1 TABLET (0.1 MG TOTAL) BY MOUTH IN  THE MORNING, AT NOON, AND AT BEDTIME.   empagliflozin  (JARDIANCE ) 25 MG TABS tablet Take 1 tablet (25 mg total) by mouth daily.   FLUoxetine (PROZAC) 20 MG tablet Take 20 mg by mouth daily.   hydrocortisone  (CORTEF ) 5 MG tablet TAKE 2 TABLETS BY MOUTH TWICE A DAY   icosapent Ethyl (VASCEPA) 1 g capsule Take 2 g by mouth 2 (two) times daily.   levothyroxine  (SYNTHROID ) 112 MCG tablet TAKE 1 TABLET BY MOUTH EVERY DAY BEFORE BREAKFAST   metoprolol tartrate (LOPRESSOR) 50 MG tablet Take 50 mg by mouth 2 (two) times daily.   potassium chloride SA (K-DUR,KLOR-CON) 20 MEQ tablet Take 20 mEq by mouth daily.   rosuvastatin (CRESTOR) 40 MG tablet Take 40 mg by mouth daily.   SYRINGE-NEEDLE, DISP, 3 ML (B-D 3CC LUER-LOK SYR 20GX1-1/2) 20G X  1-1/2 3 ML MISC USE TO INJECT TESTOSTERONE  EVERY WEEK   testosterone  cypionate (DEPOTESTOSTERONE CYPIONATE) 200 MG/ML injection INJECT 50MG  (0.25ML) INTRAMUSCULARLY EVERY 10 DAYS   [DISCONTINUED] atorvastatin  (LIPITOR) 20 MG tablet TAKE 1 TABLET BY MOUTH EVERY DAY (Patient taking differently: Take 40 mg by mouth daily.)   [DISCONTINUED] Dulaglutide  (TRULICITY ) 4.5 MG/0.5ML SOAJ Inject 4.5 mg as directed once a week.   No facility-administered encounter medications on file as of 03/20/2024.   ALLERGIES: No Known Allergies VACCINATION STATUS: Immunization History  Administered Date(s) Administered   PFIZER(Purple Top)SARS-COV-2 Vaccination 11/20/2019, 12/11/2019, 05/30/2020     He does not have acute complaints today.  Diabetes He presents for his follow-up diabetic visit. He has type 2 diabetes mellitus. Onset time: He was diagnosed at approximate age of 54 years. His disease course has been worsening. There are no hypoglycemic associated symptoms. Pertinent negatives for hypoglycemia include no confusion, headaches, pallor or seizures. Pertinent negatives for diabetes include no chest pain, no fatigue, no polydipsia, no polyphagia, no polyuria and no weakness. There are no hypoglycemic complications. Symptoms are worsening. There are no diabetic complications. (Triple bypass and mitral valve repair.) Risk factors for coronary artery disease include diabetes mellitus, dyslipidemia, hypertension, obesity, tobacco exposure, sedentary lifestyle and male sex. Current diabetic treatment includes oral agent (monotherapy). He is compliant with treatment most of the time. His weight is fluctuating minimally. He is following a generally unhealthy diet. When asked about meal planning, he reported none. He has not had a previous visit with a dietitian. He never participates in exercise. His home blood glucose trend is increasing steadily. (His point-of-care A1c was 10.1% increasing from 7.4%.  ) An ACE  inhibitor/angiotensin II receptor blocker is not being taken. He does not see a podiatrist.Eye exam is current.  Hyperlipidemia This is a chronic problem. The current episode started more than 1 year ago. The problem is uncontrolled. Recent lipid tests were reviewed and are variable. Exacerbating diseases include chronic renal disease, diabetes, hypothyroidism and obesity. Factors aggravating his hyperlipidemia include fatty foods. Pertinent negatives include no chest pain, myalgias or shortness of breath. Current antihyperlipidemic treatment includes statins. The current treatment provides moderate improvement of lipids. There are no compliance problems.  Risk factors for coronary artery disease include diabetes mellitus, dyslipidemia, hypertension, male sex, obesity and a sedentary lifestyle.  Hypertension This is a chronic problem. The current episode started more than 1 year ago. The problem has been gradually improving since onset. The problem is controlled. Pertinent negatives include no chest pain, headaches, neck pain, palpitations or shortness of breath. Agents associated with hypertension include thyroid  hormones and steroids. Risk factors for coronary artery disease include  diabetes mellitus, dyslipidemia, male gender, obesity, smoking/tobacco exposure and sedentary lifestyle. Past treatments include nothing. There are no compliance problems.  Hypertensive end-organ damage includes kidney disease and CAD/MI. Identifiable causes of hypertension include chronic renal disease and a thyroid  problem.    Review of systems  Constitutional: + Minimally fluctuating body weight,  current  Body mass index is 34.39 kg/m.  no subjective hyperthermia, no subjective hypothermia   Objective:    BP 100/76   Pulse (!) 56   Ht 5' 11 (1.803 m)   Wt 246 lb 9.6 oz (111.9 kg)   BMI 34.39 kg/m   Wt Readings from Last 3 Encounters:  03/20/24 246 lb 9.6 oz (111.9 kg)  11/16/23 247 lb 9.6 oz (112.3 kg)   07/19/23 243 lb (110.2 kg)    BP Readings from Last 3 Encounters:  03/20/24 100/76  11/16/23 104/70  07/19/23 94/68    Physical Exam- Limited  Constitutional:  Body mass index is 34.39 kg/m. , not in acute distress, normal state of mind    - Summary of his pituitary/sella MRI from 08/13/2016 as follows - IMPRESSION: 1. Transsphenoidal resection of previous pituitary tumor without evidence for residual or recurrent neoplasm. 2. Residual enhancing pituitary tissue is within normal limits. 3. Slight rightward deviation of the pituitary stalk likely related to scarring and previous surgery. 4. Slight downward deviation of the optic chiasm, also likely related to prior surgery. 5. The suprasellar brain is normal for age. 6. Extensive sinus disease as described.  Recent Results (from the past 2160 hours)  Comprehensive metabolic panel     Status: Abnormal   Collection Time: 03/12/24  8:11 AM  Result Value Ref Range   Glucose 438 (H) 70 - 99 mg/dL   BUN 17 6 - 24 mg/dL   Creatinine, Ser 8.50 (H) 0.76 - 1.27 mg/dL   eGFR 54 (L) >40 fO/fpw/8.26   BUN/Creatinine Ratio 11 9 - 20   Sodium 133 (L) 134 - 144 mmol/L   Potassium 4.4 3.5 - 5.2 mmol/L   Chloride 97 96 - 106 mmol/L   CO2 17 (L) 20 - 29 mmol/L   Calcium  9.6 8.7 - 10.2 mg/dL   Total Protein 7.2 6.0 - 8.5 g/dL   Albumin 4.5 3.8 - 4.9 g/dL   Globulin, Total 2.7 1.5 - 4.5 g/dL   Bilirubin Total 0.4 0.0 - 1.2 mg/dL   Alkaline Phosphatase 189 (H) 44 - 121 IU/L   AST 16 0 - 40 IU/L   ALT 21 0 - 44 IU/L  T4, free     Status: None   Collection Time: 03/12/24  8:11 AM  Result Value Ref Range   Free T4 1.38 0.82 - 1.77 ng/dL  Testosterone , Free, Total, SHBG     Status: Abnormal   Collection Time: 03/12/24  8:11 AM  Result Value Ref Range   Testosterone  347 264 - 916 ng/dL    Comment: Adult male reference interval is based on a population of healthy nonobese males (BMI <30) between 67 and 55 years old. Travison, et.al.  JCEM [phone]. PMID: 71675896.    Testosterone , Free 5.4 (L) 7.2 - 24.0 pg/mL   Sex Hormone Binding 25.9 19.3 - 76.4 nmol/L  CBC with Differential/Platelet     Status: Abnormal   Collection Time: 03/12/24  8:11 AM  Result Value Ref Range   WBC 8.1 3.4 - 10.8 x10E3/uL   RBC 5.64 4.14 - 5.80 x10E6/uL   Hemoglobin 16.9 13.0 - 17.7 g/dL   Hematocrit  52.4 (H) 37.5 - 51.0 %   MCV 93 79 - 97 fL   MCH 30.0 26.6 - 33.0 pg   MCHC 32.3 31.5 - 35.7 g/dL   RDW 87.7 88.3 - 84.5 %   Platelets 142 (L) 150 - 450 x10E3/uL   Neutrophils 53 Not Estab. %   Lymphs 36 Not Estab. %   Monocytes 8 Not Estab. %   Eos 2 Not Estab. %   Basos 1 Not Estab. %   Neutrophils Absolute 4.3 1.4 - 7.0 x10E3/uL   Lymphocytes Absolute 2.9 0.7 - 3.1 x10E3/uL   Monocytes Absolute 0.6 0.1 - 0.9 x10E3/uL   EOS (ABSOLUTE) 0.1 0.0 - 0.4 x10E3/uL   Basophils Absolute 0.1 0.0 - 0.2 x10E3/uL   Immature Granulocytes 0 Not Estab. %   Immature Grans (Abs) 0.0 0.0 - 0.1 x10E3/uL  HgB A1c     Status: Abnormal   Collection Time: 03/20/24 11:39 AM  Result Value Ref Range   Hemoglobin A1C     HbA1c POC (<> result, manual entry)     HbA1c, POC (prediabetic range)     HbA1c, POC (controlled diabetic range) 10.1 (A) 0.0 - 7.0 %   Lipid Panel     Component Value Date/Time   CHOL 118 11/08/2023 0856   TRIG 418 (H) 11/08/2023 0856   HDL 22 (L) 11/08/2023 0856   CHOLHDL 5.4 (H) 11/08/2023 0856   LDLCALC 36 11/08/2023 0856    Assessment & Plan:   1) Panhypopituitarism: R/t surgery for craniopharyngioma at approximate age of 39 years at [facility] .    He has adrenal insufficiency, diabetes insipidus, hypothyroidism, and hypogonadism as a result.  His previsit labs show free T4 consistent with appropriate replacement at 1.38.  TSH is not reliable in this patient with pituitary surgery.  He is advised to continue levothyroxine  112 mcg p.o. daily before breakfast.     - We discussed about the correct intake  of his thyroid  hormone, on empty stomach at fasting, with water, separated by at least 30 minutes from breakfast and other medications,  and separated by more than 4 hours from calcium , iron, multivitamins, acid reflux medications (PPIs). -Patient is made aware of the fact that thyroid  hormone replacement is needed for life, dose to be adjusted by periodic monitoring of thyroid  function tests.   He is also on hydrocortisone  10 mg p.o. twice daily, desmopressin  0.1 mg 3 times daily.  -His recent labs show that he has recovered from supraphysiologic testosterone  complicated by erythrocytosis.    His previsit labs show total testosterone  of 347 ng per DL-considered acceptable target for him.    He is advised to continue testosterone   50 mg IM every 10 days until next measurement.    It is essential to balance his target testosterone  for safety reasons. His target level testosterone  is 250-350 .   2. Type 2 diabetes with vascular complications  - Patient has currently controlled asymptomatic type 2 DM since  58 years of age.  He has lost control of his diabetes with point-of-care A1c of 10.1%.  This is largely due to treatment withdrawal.  He denies hypoglycemia.       His diabetes is complicated by complicated by coronary artery disease status post triple bypass and mitral valve repair and patient remains at exceedingly high risk for more acute and chronic complications of diabetes  which include CAD, CVA, CKD, retinopathy, and neuropathy. These are all discussed in detail with the patient.  -  He acknowledges that there is a room for improvement in his food and drink choices. - Suggestion is made for him to avoid simple carbohydrates  from his diet including Cakes, Sweet Desserts, Ice Cream, Soda (diet and regular), Sweet Tea, Candies, Chips, Cookies, Store Bought Juices, Alcohol in Excess of  1-2 drinks a day, Artificial Sweeteners,  Coffee Creamer, and Sugar-free Products, Lemonade. This  will help patient to have more stable blood glucose profile and potentially avoid unintended weight gain.     - Patient is advised to stick to a routine mealtimes to eat 3 meals  a day and avoid unnecessary snacks ( to snack only to correct hypoglycemia).  - I have approached patient with the following individualized plan to manage diabetes and patient agrees: - He may need insulin treatment briefly, in preparation, he is advised to monitor blood glucose 4 times a day-before meals and at bedtime and return with his meter and logs.    - He is also advised to call clinic for hypoglycemia under 70 or hyperglycemia above 200 mg per DL Premeal 3 x 0.    In the meantime, his advised to continue Jardiance  25 mg p.o. daily at breakfast, discussed and started metformin  500 mg p.o. twice daily, discussed and restarted Trulicity  at 3 mg subcutaneously weekly.   Side effects and precautions discussed with him.  Trulicity  will be advanced as he tolerates. - Patient specific target  A1c;  LDL, HDL, Triglycerides, and  Waist Circumference were discussed in detail.  3) BP/HTN:   His blood pressure is controlled to target.  He is on metoprolol 50 mg p.o. twice daily.     He will not tolerate any more blood pressure lowering medications at this time.  4) Lipids/HPL:  Recent lipid panel showed LDL controlled at 36.   He is advised to continue Lipitor 20 mg p.o. nightly.     Side effects and precautions discussed with him.     He is advised to avoid fried foods and butter.  5)  Weight/Diet:  His Body mass index is 34.39 kg/m.- Clearly complicating the care of his diabetes.  He is a candidate for modest weight loss.   CDE Consult has been requested, exercise, and detailed carbohydrates information provided.  - His repeat MRI of sella/pituitary in 2017- is consistent with postsurgical changes with no new lesion. Summaries dictated,  see above.   - He is status post sinus drainage by ENT with clinical  improvement. He does not have any clinical complaints of mass-effect at this time.  6) Vitamin D  deficiency: His most recent vitamin D  level was 14.  He is advised to resume and continue vitamin D3 2000 units daily.     7) Chronic Care/Health Maintenance:  -His BMI is 34.53- -a candidate for modest weight loss.   Patient on Statin medications and encouraged to continue to follow up with Ophthalmology, Podiatrist at least yearly or according to recommendations, and advised to   stay away from smoking. I have recommended yearly flu vaccine and pneumonia vaccination at least every 5 years; moderate intensity exercise for up to 150 minutes weekly; and  sleep for at least 7 hours a day.  He is advised to continue vitamin D3 5000 units daily.  - I advised patient to maintain close follow up with Diedra Senior, MD for primary care needs.   I spent  42  minutes in the care of the patient today including review of labs from CMP, Lipids, Thyroid  Function,  Hematology (current and previous including abstractions from other facilities); face-to-face time discussing  his blood glucose readings/logs, discussing hypoglycemia and hyperglycemia episodes and symptoms, medications doses, his options of short and long term treatment based on the latest standards of care / guidelines;  discussion about incorporating lifestyle medicine;  and documenting the encounter. Risk reduction counseling performed per USPSTF guidelines to reduce  obesity and cardiovascular risk factors.     Please refer to Patient Instructions for Blood Glucose Monitoring and Insulin/Medications Dosing Guide  in media tab for additional information. Please  also refer to  Patient Self Inventory in the Media  tab for reviewed elements of pertinent patient history.  Alm JONELLE Rase participated in the discussions, expressed understanding, and voiced agreement with the above plans.  All questions were answered to his satisfaction. he is  encouraged to contact clinic should he have any questions or concerns prior to his return visit.   Follow up plan: - Return in about 4 weeks (around 04/17/2024) for F/U with Meter/CGM /Logs Only - no Labs.  Benton Rio, [facility] [address] [place], [place] [address] Phone: [phone] Fax: [phone] 03/20/2024, 1:11 PM

## 2024-03-20 NOTE — Telephone Encounter (Signed)
 Received notice pt's Trulicity  4.5mg  weekly needs a prior auth.

## 2024-03-20 NOTE — Patient Instructions (Signed)

## 2024-03-20 NOTE — Telephone Encounter (Signed)
 Pharmacy Patient Advocate Encounter   Received notification from Pt Calls Messages that prior authorization for Trulicity  3 mg is required/requested.   Insurance verification completed.   The patient is insured through [employer] .   Per test claim: PA required; PA submitted to above mentioned insurance via CoverMyMeds Key/confirmation #/EOC [id number] Status is pending

## 2024-03-22 ENCOUNTER — Other Ambulatory Visit: Payer: Self-pay | Admitting: "Endocrinology

## 2024-03-28 NOTE — Telephone Encounter (Signed)
 Pharmacy Patient Advocate Encounter  Received notification from [employer] that Prior Authorization for trulicity  3mg  has been APPROVED from 03/20/24 to 03/20/25   PA #/Case ID/Reference #: [id number]

## 2024-04-03 ENCOUNTER — Telehealth: Payer: Self-pay

## 2024-04-03 NOTE — Telephone Encounter (Signed)
 Left a message requesting pt to return call to the office

## 2024-04-17 ENCOUNTER — Ambulatory Visit (INDEPENDENT_AMBULATORY_CARE_PROVIDER_SITE_OTHER): Admitting: "Endocrinology

## 2024-04-17 ENCOUNTER — Encounter: Payer: Self-pay | Admitting: "Endocrinology

## 2024-04-17 VITALS — BP 94/66 | HR 56 | Ht 71.0 in | Wt 244.0 lb

## 2024-04-17 DIAGNOSIS — E1159 Type 2 diabetes mellitus with other circulatory complications: Secondary | ICD-10-CM | POA: Diagnosis not present

## 2024-04-17 DIAGNOSIS — E232 Diabetes insipidus: Secondary | ICD-10-CM | POA: Diagnosis not present

## 2024-04-17 DIAGNOSIS — Z7984 Long term (current) use of oral hypoglycemic drugs: Secondary | ICD-10-CM | POA: Diagnosis not present

## 2024-04-17 DIAGNOSIS — E291 Testicular hypofunction: Secondary | ICD-10-CM

## 2024-04-17 DIAGNOSIS — E230 Hypopituitarism: Secondary | ICD-10-CM

## 2024-04-17 DIAGNOSIS — I10 Essential (primary) hypertension: Secondary | ICD-10-CM

## 2024-04-17 DIAGNOSIS — E785 Hyperlipidemia, unspecified: Secondary | ICD-10-CM

## 2024-04-17 MED ORDER — TRULICITY 4.5 MG/0.5ML ~~LOC~~ SOAJ: 4.5000 mg | SUBCUTANEOUS | 1 refills | Status: DC

## 2024-04-17 MED ORDER — FREESTYLE LIBRE 3 PLUS SENSOR MISC
1 refills | Status: DC
Start: 2024-04-17 — End: 2024-04-18

## 2024-04-17 NOTE — Patient Instructions (Signed)

## 2024-04-17 NOTE — Progress Notes (Signed)
 04/17/2024   Endocrinology follow-up note    Subjective:    Patient ID: Ernest Graham, male    DOB: 03-22-1966.    -He is on regular follow-up in this clinic for  multiple medical problems including type 2 diabetes, panhypopituitarism: Hypothyroidism, diabetes insipidus, hypogonadism .  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  He is on multiple hormone replacements including levothyroxine , hydrocortisone , desmopressin , testosterone .    He recently lost control of glycemia.  He was given adjusted medications and instructed to monitor blood glucose.  He returns with his meter, logs and accompanied by his wife for evaluation.   He has engaged with his remarkable change in his diet and exercise and achieved dramatic control of glycemia to target.  His average blood glucose is 116-133 mg per DL since his last visit.   He denies heat or cold intolerance. He denies polyuria, nocturia, nor recent hospitalizations.  He has no new complaints today.   Past Medical History:  Diagnosis Date   Adrenal disease (HCC)    Diabetes mellitus, type II (HCC)    Heart attack (HCC)    Hyperlipidemia    Past Surgical History:  Procedure Laterality Date   CHOLECYSTECTOMY  01/2023   CORONARY ARTERY BYPASS GRAFT     MITRAL VALVE REPAIR     NASAL SINUS SURGERY  12/2023   Removal of Cricopharyngeoma     Social History   Socioeconomic History   Marital status: Married    Spouse name: Not on file   Number of children: Not on file   Years of education: Not on file   Highest education level: Not on file  Occupational History   Not on file  Tobacco Use   Smoking status: Former   Smokeless tobacco: Never  Vaping Use   Vaping status: Never Used  Substance and Sexual Activity   Alcohol use: No    Alcohol/week: 0.0 standard drinks of alcohol   Drug use: No   Sexual activity: Not on file  Other Topics Concern   Not on file  Social  History Narrative   Not on file   Social Drivers of Health   Financial Resource Strain: Not on file  Food Insecurity: Not on file  Transportation Needs: Not on file  Physical Activity: Not on file  Stress: Not on file  Social Connections: Not on file   Outpatient Encounter Medications as of 04/17/2024  Medication Sig   Continuous Glucose Sensor (FREESTYLE LIBRE 3 PLUS SENSOR) MISC Change sensor every 15 days.   Dulaglutide  (TRULICITY ) 4.5 MG/0.5ML SOAJ Inject 4.5 mg as directed once a week.   aspirin 81 MG EC tablet Take 81 mg by mouth daily. Swallow whole.   B-D LUER-LOK SYRINGE 20G X 1 1 ML MISC USE TO INJECT TESTOSTERONE  EVERY WEEK   busPIRone (BUSPAR) 15 MG tablet Take 15 mg by mouth 2 (two) times daily.   Cholecalciferol  ([employer] D3) 125 MCG (5000 UT) capsule Take 1 capsule (5,000 Units total) by mouth daily.   clonazePAM (KLONOPIN) 0.5 MG tablet Take 0.5 mg by mouth 3 (three) times daily as needed for anxiety.   desmopressin  (DDAVP ) 0.1 MG tablet TAKE 1 TABLET (0.1 MG TOTAL) BY MOUTH IN THE MORNING, AT NOON, AND AT BEDTIME.   empagliflozin  (JARDIANCE ) 25 MG TABS tablet Take 1 tablet (25 mg total) by mouth daily.   FLUoxetine (PROZAC) 20 MG tablet Take 20 mg by mouth daily.   hydrocortisone  (CORTEF ) 5 MG  tablet TAKE 2 TABLETS BY MOUTH TWICE A DAY   icosapent Ethyl (VASCEPA) 1 g capsule Take 2 g by mouth 2 (two) times daily.   levothyroxine  (SYNTHROID ) 112 MCG tablet TAKE 1 TABLET BY MOUTH EVERY DAY BEFORE BREAKFAST   metFORMIN  (GLUCOPHAGE ) 500 MG tablet Take 1 tablet (500 mg total) by mouth 2 (two) times daily with a meal.   metoprolol tartrate (LOPRESSOR) 50 MG tablet Take 50 mg by mouth 2 (two) times daily.   potassium chloride SA (K-DUR,KLOR-CON) 20 MEQ tablet Take 20 mEq by mouth daily.   rosuvastatin (CRESTOR) 40 MG tablet Take 40 mg by mouth daily.   SYRINGE-NEEDLE, DISP, 3 ML (B-D 3CC LUER-LOK SYR 20GX1-1/2) 20G X 1-1/2 3 ML MISC USE TO INJECT TESTOSTERONE  EVERY WEEK    testosterone  cypionate (DEPOTESTOSTERONE CYPIONATE) 200 MG/ML injection INJECT 50MG  (0.25ML) INTRAMUSCULARLY EVERY 10 DAYS   [DISCONTINUED] Dulaglutide  (TRULICITY ) 3 MG/0.5ML SOAJ Inject 3 mg as directed once a week.   No facility-administered encounter medications on file as of 04/17/2024.   ALLERGIES: No Known Allergies VACCINATION STATUS: Immunization History  Administered Date(s) Administered   PFIZER(Purple Top)SARS-COV-2 Vaccination 11/20/2019, 12/11/2019, 05/30/2020     He does not have acute complaints today.  Diabetes He presents for his follow-up diabetic visit. He has type 2 diabetes mellitus. Onset time: He was diagnosed at approximate age of 1 years. His disease course has been improving. There are no hypoglycemic associated symptoms. Pertinent negatives for hypoglycemia include no confusion, headaches, pallor or seizures. Pertinent negatives for diabetes include no chest pain, no fatigue, no polydipsia, no polyphagia, no polyuria and no weakness. There are no hypoglycemic complications. Symptoms are improving. There are no diabetic complications. (Triple bypass and mitral valve repair.) Risk factors for coronary artery disease include diabetes mellitus, dyslipidemia, hypertension, obesity, tobacco exposure, sedentary lifestyle and male sex. Current diabetic treatment includes oral agent (monotherapy). He is compliant with treatment most of the time. His weight is decreasing steadily. He is following a generally unhealthy diet. When asked about meal planning, he reported none. He has not had a previous visit with a dietitian. He never participates in exercise. His home blood glucose trend is decreasing steadily. His breakfast blood glucose range is generally 130-140 mg/dl. His lunch blood glucose range is generally 130-140 mg/dl. His dinner blood glucose range is generally 130-140 mg/dl. His bedtime blood glucose range is generally 130-140 mg/dl. His overall blood glucose range is  130-140 mg/dl. (He presents with his meter and logs showing control of glycemic profile to target averaging 116-133 mg per DL since last visit 2 weeks ago.  His recent A1c was 10.1%.    ) An ACE inhibitor/angiotensin II receptor blocker is not being taken. He does not see a podiatrist.Eye exam is current.  Hyperlipidemia This is a chronic problem. The current episode started more than 1 year ago. The problem is uncontrolled. Recent lipid tests were reviewed and are variable. Exacerbating diseases include chronic renal disease, diabetes, hypothyroidism and obesity. Factors aggravating his hyperlipidemia include fatty foods. Pertinent negatives include no chest pain, myalgias or shortness of breath. Current antihyperlipidemic treatment includes statins. The current treatment provides moderate improvement of lipids. There are no compliance problems.  Risk factors for coronary artery disease include diabetes mellitus, dyslipidemia, hypertension, male sex, obesity and a sedentary lifestyle.  Hypertension This is a chronic problem. The current episode started more than 1 year ago. The problem has been gradually improving since onset. The problem is controlled. Pertinent negatives include no chest pain,  headaches, neck pain, palpitations or shortness of breath. Agents associated with hypertension include thyroid  hormones and steroids. Risk factors for coronary artery disease include diabetes mellitus, dyslipidemia, male gender, obesity, smoking/tobacco exposure and sedentary lifestyle. Past treatments include nothing. There are no compliance problems.  Hypertensive end-organ damage includes kidney disease and CAD/MI. Identifiable causes of hypertension include chronic renal disease and a thyroid  problem.    Review of systems  Constitutional: + Minimally fluctuating body weight,  current  Body mass index is 34.03 kg/m.  no subjective hyperthermia, no subjective hypothermia   Objective:    BP 94/66   Pulse  (!) 56   Ht 5' 11 (1.803 m)   Wt 244 lb (110.7 kg)   BMI 34.03 kg/m   Wt Readings from Last 3 Encounters:  04/17/24 244 lb (110.7 kg)  03/20/24 246 lb 9.6 oz (111.9 kg)  11/16/23 247 lb 9.6 oz (112.3 kg)    BP Readings from Last 3 Encounters:  04/17/24 94/66  03/20/24 100/76  11/16/23 104/70    Physical Exam- Limited  Constitutional:  Body mass index is 34.03 kg/m. , not in acute distress, normal state of mind    - Summary of his pituitary/sella MRI from 08/13/2016 as follows - IMPRESSION: 1. Transsphenoidal resection of previous pituitary tumor without evidence for residual or recurrent neoplasm. 2. Residual enhancing pituitary tissue is within normal limits. 3. Slight rightward deviation of the pituitary stalk likely related to scarring and previous surgery. 4. Slight downward deviation of the optic chiasm, also likely related to prior surgery. 5. The suprasellar brain is normal for age. 6. Extensive sinus disease as described.  Recent Results (from the past 2160 hours)  Comprehensive metabolic panel     Status: Abnormal   Collection Time: 03/12/24  8:11 AM  Result Value Ref Range   Glucose 438 (H) 70 - 99 mg/dL   BUN 17 6 - 24 mg/dL   Creatinine, Ser 8.50 (H) 0.76 - 1.27 mg/dL   eGFR 54 (L) >40 fO/fpw/8.26   BUN/Creatinine Ratio 11 9 - 20   Sodium 133 (L) 134 - 144 mmol/L   Potassium 4.4 3.5 - 5.2 mmol/L   Chloride 97 96 - 106 mmol/L   CO2 17 (L) 20 - 29 mmol/L   Calcium  9.6 8.7 - 10.2 mg/dL   Total Protein 7.2 6.0 - 8.5 g/dL   Albumin 4.5 3.8 - 4.9 g/dL   Globulin, Total 2.7 1.5 - 4.5 g/dL   Bilirubin Total 0.4 0.0 - 1.2 mg/dL   Alkaline Phosphatase 189 (H) 44 - 121 IU/L   AST 16 0 - 40 IU/L   ALT 21 0 - 44 IU/L  T4, free     Status: None   Collection Time: 03/12/24  8:11 AM  Result Value Ref Range   Free T4 1.38 0.82 - 1.77 ng/dL  Testosterone , Free, Total, SHBG     Status: Abnormal   Collection Time: 03/12/24  8:11 AM  Result Value Ref Range    Testosterone  347 264 - 916 ng/dL    Comment: Adult male reference interval is based on a population of healthy nonobese males (BMI <30) between 31 and 82 years old. Travison, et.al. JCEM [phone]. PMID: 71675896.    Testosterone , Free 5.4 (L) 7.2 - 24.0 pg/mL   Sex Hormone Binding 25.9 19.3 - 76.4 nmol/L  CBC with Differential/Platelet     Status: Abnormal   Collection Time: 03/12/24  8:11 AM  Result Value Ref Range   WBC 8.1 3.4 -  10.8 x10E3/uL   RBC 5.64 4.14 - 5.80 x10E6/uL   Hemoglobin 16.9 13.0 - 17.7 g/dL   Hematocrit 47.5 (H) 62.4 - 51.0 %   MCV 93 79 - 97 fL   MCH 30.0 26.6 - 33.0 pg   MCHC 32.3 31.5 - 35.7 g/dL   RDW 87.7 88.3 - 84.5 %   Platelets 142 (L) 150 - 450 x10E3/uL   Neutrophils 53 Not Estab. %   Lymphs 36 Not Estab. %   Monocytes 8 Not Estab. %   Eos 2 Not Estab. %   Basos 1 Not Estab. %   Neutrophils Absolute 4.3 1.4 - 7.0 x10E3/uL   Lymphocytes Absolute 2.9 0.7 - 3.1 x10E3/uL   Monocytes Absolute 0.6 0.1 - 0.9 x10E3/uL   EOS (ABSOLUTE) 0.1 0.0 - 0.4 x10E3/uL   Basophils Absolute 0.1 0.0 - 0.2 x10E3/uL   Immature Granulocytes 0 Not Estab. %   Immature Grans (Abs) 0.0 0.0 - 0.1 x10E3/uL  HgB A1c     Status: Abnormal   Collection Time: 03/20/24 11:39 AM  Result Value Ref Range   Hemoglobin A1C     HbA1c POC (<> result, manual entry)     HbA1c, POC (prediabetic range)     HbA1c, POC (controlled diabetic range) 10.1 (A) 0.0 - 7.0 %   Lipid Panel     Component Value Date/Time   CHOL 118 11/08/2023 0856   TRIG 418 (H) 11/08/2023 0856   HDL 22 (L) 11/08/2023 0856   CHOLHDL 5.4 (H) 11/08/2023 0856   LDLCALC 36 11/08/2023 0856    Assessment & Plan:   1) Panhypopituitarism: R/t surgery for craniopharyngioma at approximate age of 39 years at [facility] .    He has adrenal insufficiency, diabetes insipidus, hypothyroidism, and hypogonadism as a result.  His previsit labs show free T4 consistent with appropriate replacement at 1.38.   TSH is not reliable in this patient with pituitary surgery.  He is advised to continue levothyroxine  112 mcg p.o. daily before breakfast.     - We discussed about the correct intake of his thyroid  hormone, on empty stomach at fasting, with water, separated by at least 30 minutes from breakfast and other medications,  and separated by more than 4 hours from calcium , iron, multivitamins, acid reflux medications (PPIs). -Patient is made aware of the fact that thyroid  hormone replacement is needed for life, dose to be adjusted by periodic monitoring of thyroid  function tests.  He is also on hydrocortisone  10 mg p.o. twice daily, desmopressin  0.1 mg 3 times daily.  -His recent labs show that he has recovered from supraphysiologic testosterone  complicated by erythrocytosis.    His previsit labs show total testosterone  of 347 ng per DL-considered acceptable target for him.    He is advised to continue testosterone   50 mg IM every 10 days until next measurement.    It is essential to balance his target testosterone  for safety reasons. His target level testosterone  is 250-350 .   2. Type 2 diabetes with vascular complications  - Patient has currently controlled asymptomatic type 2 DM since  58 years of age.  He presents with his meter and logs showing control of glycemic profile to target averaging 116-133 mg per DL since last visit 2 weeks ago.  His recent A1c was 10.1%.     His diabetes is complicated by complicated by coronary artery disease status post triple bypass and mitral valve repair and patient remains at exceedingly high risk for more acute and  chronic complications of diabetes  which include CAD, CVA, CKD, retinopathy, and neuropathy. These are all discussed in detail with the patient.  -  He acknowledges that there is a room for improvement in his food and drink choices. - Suggestion is made for him to avoid simple carbohydrates  from his diet including Cakes, Sweet Desserts, Ice Cream,  Soda (diet and regular), Sweet Tea, Candies, Chips, Cookies, Store Bought Juices, Alcohol in Excess of  1-2 drinks a day, Artificial Sweeteners,  Coffee Creamer, and Sugar-free Products, Lemonade. This will help patient to have more stable blood glucose profile and potentially avoid unintended weight gain.     - Patient is advised to stick to a routine mealtimes to eat 3 meals  a day and avoid unnecessary snacks ( to snack only to correct hypoglycemia).  - I have approached patient with the following individualized plan to manage diabetes and patient agrees: -Based on his presentation with near target glycemic profile, he would not need insulin treatment for now.  He would benefit from a higher dose of Trulicity , discussed and increase his Trulicity  to 4.5 mg subcutaneously weekly.  Side effects and precautions discussed with him.    He is advised in willing to monitor blood glucose twice a day-daily before breakfast and at bedtime.  He would  also benefit from a CGM, freestyle libre 3+ device was prescribed for him.       - He is also advised to call clinic for hypoglycemia under 70 or hyperglycemia above 200 mg per DL Premeal 3 x 0.    -He is advised to continue Jardiance  25 mg p.o. daily at breakfast, advised to continue metformin  500 mg p.o. with breakfast and supper.   Side effects of these medications were discussed with him.  - Patient specific target  A1c;  LDL, HDL, Triglycerides, and  Waist Circumference were discussed in detail.  3) BP/HTN:   -His blood pressure was controlled to target.  He is on metoprolol 50 mg p.o. twice daily.     He will not tolerate any more blood pressure lowering medications at this time.  4) Lipids/HPL:  Recent lipid panel showed LDL controlled at 36.   He is advised to continue Lipitor 20 mg p.o. nightly.     Side effects and precautions discussed with him.     He is advised to avoid fried foods and butter.  5)  Weight/Diet:  His Body mass index is  34.03 kg/m.- Clearly complicating the care of his diabetes.  He is a candidate for modest weight loss.   CDE Consult has been requested, exercise, and detailed carbohydrates information provided.  - His repeat MRI of sella/pituitary in 2017- is consistent with postsurgical changes with no new lesion. Summaries dictated,  see above.   - He is status post sinus drainage by ENT with clinical improvement. He does not have any clinical complaints of mass-effect at this time.  6) Vitamin D  deficiency: His most recent vitamin D  level was 14.  He is advised to resume and continue vitamin D3 2000 units daily.     7) Chronic Care/Health Maintenance:  -His BMI is 34.53- -a candidate for modest weight loss.   Patient on Statin medications and encouraged to continue to follow up with Ophthalmology, Podiatrist at least yearly or according to recommendations, and advised to   stay away from smoking. I have recommended yearly flu vaccine and pneumonia vaccination at least every 5 years; moderate intensity exercise for up to 150  minutes weekly; and  sleep for at least 7 hours a day.  He is advised to continue vitamin D3 5000 units daily.  - I advised patient to maintain close follow up with Diedra Senior, MD for primary care needs.   I spent  26  minutes in the care of the patient today including review of labs from CMP, Lipids, Thyroid  Function, Hematology (current and previous including abstractions from other facilities); face-to-face time discussing  his blood glucose readings/logs, discussing hypoglycemia and hyperglycemia episodes and symptoms, medications doses, his options of short and long term treatment based on the latest standards of care / guidelines;  discussion about incorporating lifestyle medicine;  and documenting the encounter. Risk reduction counseling performed per USPSTF guidelines to reduce  obesity and cardiovascular risk factors.     Please refer to Patient Instructions for Blood  Glucose Monitoring and Insulin/Medications Dosing Guide  in media tab for additional information. Please  also refer to  Patient Self Inventory in the Media  tab for reviewed elements of pertinent patient history.  Alm JONELLE Rase participated in the discussions, expressed understanding, and voiced agreement with the above plans.  All questions were answered to his satisfaction. he is encouraged to contact clinic should he have any questions or concerns prior to his return visit.   Follow up plan: - Return in about 4 months (around 08/17/2024) for F/U with Pre-visit Labs, Meter/CGM/Logs, A1c here.  Benton Rio, [facility] [address] [place], [place] [address] Phone: [phone] Fax: [phone] 04/17/2024, 5:08 PM

## 2024-04-18 ENCOUNTER — Other Ambulatory Visit: Payer: Self-pay

## 2024-04-18 DIAGNOSIS — E1159 Type 2 diabetes mellitus with other circulatory complications: Secondary | ICD-10-CM

## 2024-04-18 MED ORDER — DEXCOM G7 SENSOR MISC: 1 refills | Status: AC

## 2024-04-29 ENCOUNTER — Other Ambulatory Visit: Payer: Self-pay | Admitting: "Endocrinology

## 2024-05-07 ENCOUNTER — Other Ambulatory Visit: Payer: Self-pay | Admitting: "Endocrinology

## 2024-06-24 ENCOUNTER — Other Ambulatory Visit: Payer: Self-pay | Admitting: "Endocrinology

## 2024-07-27 ENCOUNTER — Other Ambulatory Visit: Payer: Self-pay | Admitting: "Endocrinology

## 2024-08-07 ENCOUNTER — Other Ambulatory Visit: Payer: Self-pay | Admitting: "Endocrinology

## 2024-08-14 LAB — BASIC METABOLIC PANEL WITH GFR
BUN: 12 (ref 4–21)
Creatinine: 1.3 (ref 0.6–1.3)

## 2024-08-14 LAB — LIPID PANEL
Cholesterol: 113 (ref 0–200)
HDL: 30 — AB (ref 35–70)
LDL Cholesterol: 52
Triglycerides: 153 (ref 40–160)

## 2024-08-15 LAB — LAB REPORT - SCANNED: A1c: 6

## 2024-08-16 LAB — TESTOSTERONE: Testosterone: 263

## 2024-08-23 ENCOUNTER — Encounter: Payer: Self-pay | Admitting: "Endocrinology

## 2024-08-23 ENCOUNTER — Ambulatory Visit (INDEPENDENT_AMBULATORY_CARE_PROVIDER_SITE_OTHER): Admitting: "Endocrinology

## 2024-08-23 VITALS — BP 102/64 | HR 68 | Ht 71.0 in | Wt 234.4 lb

## 2024-08-23 DIAGNOSIS — Z6832 Body mass index (BMI) 32.0-32.9, adult: Secondary | ICD-10-CM

## 2024-08-23 DIAGNOSIS — E890 Postprocedural hypothyroidism: Secondary | ICD-10-CM | POA: Diagnosis not present

## 2024-08-23 DIAGNOSIS — E1159 Type 2 diabetes mellitus with other circulatory complications: Secondary | ICD-10-CM | POA: Diagnosis not present

## 2024-08-23 DIAGNOSIS — E6609 Other obesity due to excess calories: Secondary | ICD-10-CM

## 2024-08-23 DIAGNOSIS — E291 Testicular hypofunction: Secondary | ICD-10-CM

## 2024-08-23 DIAGNOSIS — E66811 Obesity, class 1: Secondary | ICD-10-CM | POA: Insufficient documentation

## 2024-08-23 DIAGNOSIS — E230 Hypopituitarism: Secondary | ICD-10-CM | POA: Diagnosis not present

## 2024-08-23 DIAGNOSIS — Z7985 Long-term (current) use of injectable non-insulin antidiabetic drugs: Secondary | ICD-10-CM | POA: Diagnosis not present

## 2024-08-23 DIAGNOSIS — E232 Diabetes insipidus: Secondary | ICD-10-CM

## 2024-08-23 NOTE — Patient Instructions (Signed)

## 2024-08-23 NOTE — Progress Notes (Signed)
 08/23/2024   Endocrinology follow-up note    Subjective:    Patient ID: Ernest Graham, male    DOB: September 14, 1965.    -He is on regular follow-up in this clinic for  multiple medical problems including type 2 diabetes, panhypopituitarism: Hypothyroidism, diabetes insipidus, hypogonadism .  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  He is on multiple hormone replacements including levothyroxine , hydrocortisone , desmopressin , testosterone .    He presents with much better control of glycemia with previsit labs showing A1c of 6% improving from 10.1%.    He has engaged with his remarkable change in his diet and exercise and achieved dramatic control of glycemia to target.  His average blood glucose is 100-135 mg per DL since his last visit.   He denies heat or cold intolerance. He denies polyuria, nocturia, nor recent hospitalizations.  He has no new complaints today.   Past Medical History:  Diagnosis Date   Adrenal disease    Diabetes mellitus, type II (HCC)    Heart attack (HCC)    Hyperlipidemia    Past Surgical History:  Procedure Laterality Date   CHOLECYSTECTOMY  01/2023   CORONARY ARTERY BYPASS GRAFT     MITRAL VALVE REPAIR     NASAL SINUS SURGERY  12/2023   Removal of Cricopharyngeoma     Social History   Socioeconomic History   Marital status: Married    Spouse name: Not on file   Number of children: Not on file   Years of education: Not on file   Highest education level: Not on file  Occupational History   Not on file  Tobacco Use   Smoking status: Former   Smokeless tobacco: Never  Vaping Use   Vaping status: Never Used  Substance and Sexual Activity   Alcohol use: No    Alcohol/week: 0.0 standard drinks of alcohol   Drug use: No   Sexual activity: Not on file  Other Topics Concern   Not on file  Social History Narrative   Not on file   Social Drivers of Health   Tobacco Use: Medium  Risk (08/23/2024)   Patient History    Smoking Tobacco Use: Former    Smokeless Tobacco Use: Never    Passive Exposure: Not on Actuary Strain: Not on file  Food Insecurity: Not on file  Transportation Needs: Not on file  Physical Activity: Not on file  Stress: Not on file  Social Connections: Not on file  Depression (EYV7-0): Not on file  Alcohol Screen: Not on file  Housing: Not on file  Utilities: Not on file  Health Literacy: Not on file   Outpatient Encounter Medications as of 08/23/2024  Medication Sig   aspirin 81 MG EC tablet Take 81 mg by mouth daily. Swallow whole.   B-D LUER-LOK SYRINGE 20G X 1 1 ML MISC USE TO INJECT TESTOSTERONE  EVERY WEEK   busPIRone (BUSPAR) 15 MG tablet Take 15 mg by mouth 2 (two) times daily.   Cholecalciferol  ([employer] D3) 125 MCG (5000 UT) capsule Take 1 capsule (5,000 Units total) by mouth daily.   clonazePAM (KLONOPIN) 0.5 MG tablet Take 0.5 mg by mouth 3 (three) times daily as needed for anxiety.   Continuous Glucose Sensor (DEXCOM G7 SENSOR) MISC USE TO MONITOR GLUCOSE CONTINUOUSLY AS INSTRUCTED   desmopressin  (DDAVP ) 0.1 MG tablet TAKE 1 TABLET (0.1 MG TOTAL) BY MOUTH IN THE MORNING, AT NOON, AND AT BEDTIME.   Dulaglutide  (TRULICITY ) 4.5  MG/0.5ML SOAJ Inject 4.5 mg as directed once a week.   FLUoxetine (PROZAC) 20 MG tablet Take 20 mg by mouth daily.   hydrocortisone  (CORTEF ) 5 MG tablet TAKE 2 TABLETS BY MOUTH TWICE A DAY   icosapent Ethyl (VASCEPA) 1 g capsule Take 2 g by mouth 2 (two) times daily.   JARDIANCE  25 MG TABS tablet TAKE 1 TABLET (25 MG TOTAL) BY MOUTH DAILY.   levothyroxine  (SYNTHROID ) 112 MCG tablet TAKE 1 TABLET BY MOUTH EVERY DAY BEFORE BREAKFAST   metoprolol tartrate (LOPRESSOR) 50 MG tablet Take 50 mg by mouth 2 (two) times daily.   potassium chloride SA (K-DUR,KLOR-CON) 20 MEQ tablet Take 20 mEq by mouth daily.   rosuvastatin (CRESTOR) 40 MG tablet Take 40 mg by mouth daily.   SYRINGE-NEEDLE, DISP, 3 ML (B-D  3CC LUER-LOK SYR 20GX1-1/2) 20G X 1-1/2 3 ML MISC USE TO INJECT TESTOSTERONE  EVERY WEEK   testosterone  cypionate (DEPOTESTOSTERONE CYPIONATE) 200 MG/ML injection INJECT 50MG  (0.25ML) INTRAMUSCULARLY EVERY 10 DAYS   [DISCONTINUED] metFORMIN  (GLUCOPHAGE ) 500 MG tablet Take 1 tablet (500 mg total) by mouth 2 (two) times daily with a meal.   No facility-administered encounter medications on file as of 08/23/2024.   ALLERGIES: No Known Allergies VACCINATION STATUS: Immunization History  Administered Date(s) Administered   PFIZER(Purple Top)SARS-COV-2 Vaccination 11/20/2019, 12/11/2019, 05/30/2020     He does not have acute complaints today.  Diabetes He presents for his follow-up diabetic visit. He has type 2 diabetes mellitus. Onset time: He was diagnosed at approximate age of 7 years. His disease course has been improving. There are no hypoglycemic associated symptoms. Pertinent negatives for hypoglycemia include no confusion, headaches, pallor or seizures. Pertinent negatives for diabetes include no chest pain, no fatigue, no polydipsia, no polyphagia, no polyuria and no weakness. There are no hypoglycemic complications. Symptoms are improving. There are no diabetic complications. (Triple bypass and mitral valve repair.) Risk factors for coronary artery disease include diabetes mellitus, dyslipidemia, hypertension, obesity, tobacco exposure, sedentary lifestyle and male sex. Current diabetic treatment includes oral agent (monotherapy). He is compliant with treatment most of the time. His weight is decreasing steadily. He is following a generally unhealthy diet. When asked about meal planning, he reported none. He has not had a previous visit with a dietitian. He never participates in exercise. His home blood glucose trend is decreasing steadily. His breakfast blood glucose range is generally 110-130 mg/dl. His bedtime blood glucose range is generally 110-130 mg/dl. His overall blood glucose range  is 110-130 mg/dl. (    ) An ACE inhibitor/angiotensin II receptor blocker is not being taken. He does not see a podiatrist.Eye exam is current.  Hyperlipidemia This is a chronic problem. The current episode started more than 1 year ago. The problem is uncontrolled. Recent lipid tests were reviewed and are variable. Exacerbating diseases include chronic renal disease, diabetes, hypothyroidism and obesity. Factors aggravating his hyperlipidemia include fatty foods. Pertinent negatives include no chest pain, myalgias or shortness of breath. Current antihyperlipidemic treatment includes statins. The current treatment provides moderate improvement of lipids. There are no compliance problems.  Risk factors for coronary artery disease include diabetes mellitus, dyslipidemia, hypertension, male sex, obesity and a sedentary lifestyle.  Hypertension This is a chronic problem. The current episode started more than 1 year ago. The problem has been gradually improving since onset. The problem is controlled. Pertinent negatives include no chest pain, headaches, neck pain, palpitations or shortness of breath. Agents associated with hypertension include thyroid  hormones and steroids. Risk  factors for coronary artery disease include diabetes mellitus, dyslipidemia, male gender, obesity, smoking/tobacco exposure and sedentary lifestyle. Past treatments include nothing. There are no compliance problems.  Hypertensive end-organ damage includes kidney disease and CAD/MI. Identifiable causes of hypertension include chronic renal disease and a thyroid  problem.    Review of systems  Constitutional: + Minimally fluctuating body weight,  current  Body mass index is 32.69 kg/m.  no subjective hyperthermia, no subjective hypothermia   Objective:    BP 102/64   Pulse 68   Ht 5' 11 (1.803 m)   Wt 234 lb 6.4 oz (106.3 kg)   BMI 32.69 kg/m   Wt Readings from Last 3 Encounters:  08/23/24 234 lb 6.4 oz (106.3 kg)  04/17/24  244 lb (110.7 kg)  03/20/24 246 lb 9.6 oz (111.9 kg)    BP Readings from Last 3 Encounters:  08/23/24 102/64  04/17/24 94/66  03/20/24 100/76    Physical Exam- Limited  Constitutional:  Body mass index is 32.69 kg/m. , not in acute distress, normal state of mind    - Summary of his pituitary/sella MRI from 08/13/2016 as follows - IMPRESSION: 1. Transsphenoidal resection of previous pituitary tumor without evidence for residual or recurrent neoplasm. 2. Residual enhancing pituitary tissue is within normal limits. 3. Slight rightward deviation of the pituitary stalk likely related to scarring and previous surgery. 4. Slight downward deviation of the optic chiasm, also likely related to prior surgery. 5. The suprasellar brain is normal for age. 6. Extensive sinus disease as described.  Recent Results (from the past 2160 hours)  Basic metabolic panel with GFR     Status: None   Collection Time: 08/14/24 12:00 AM  Result Value Ref Range   BUN 12 4 - 21   Creatinine 1.3 0.6 - 1.3  Lipid panel     Status: Abnormal   Collection Time: 08/14/24 12:00 AM  Result Value Ref Range   Triglycerides 153 40 - 160   Cholesterol 113 0 - 200   HDL 30 (A) 35 - 70   LDL Cholesterol 52   Testosterone      Status: None   Collection Time: 08/14/24 12:00 AM  Result Value Ref Range   Testosterone  263   Lab report - scanned     Status: None   Collection Time: 08/15/24 12:00 AM  Result Value Ref Range   A1c 6.0     Comment: Abstracted by HIM   Lipid Panel     Component Value Date/Time   CHOL 113 08/14/2024 0000   CHOL 118 11/08/2023 0856   TRIG 153 08/14/2024 0000   HDL 30 (A) 08/14/2024 0000   HDL 22 (L) 11/08/2023 0856   CHOLHDL 5.4 (H) 11/08/2023 0856   LDLCALC 52 08/14/2024 0000   LDLCALC 36 11/08/2023 0856    Assessment & Plan:   1) Panhypopituitarism: R/t surgery for craniopharyngioma at approximate age of 39 years at [facility] [place] .    He has adrenal insufficiency,  diabetes insipidus, hypothyroidism, and hypogonadism as a result.  His previsit labs show free T4 consistent with appropriate replacement at 1.4  TSH is not reliable in this patient with pituitary surgery.  He is advised to continue levothyroxine  112 mcg p.o. daily before breakfast.     - We discussed about the correct intake of his thyroid  hormone, on empty stomach at fasting, with water, separated by at least 30 minutes from breakfast and other medications,  and separated by more than 4 hours from calcium ,  iron, multivitamins, acid reflux medications (PPIs). -Patient is made aware of the fact that thyroid  hormone replacement is needed for life, dose to be adjusted by periodic monitoring of thyroid  function tests.   He is also on hydrocortisone  10 mg p.o. twice daily, desmopressin  0.1 mg 3 times daily.  -His recent labs show that he has recovered from supraphysiologic testosterone  complicated by erythrocytosis.    His previsit labs show total testosterone  of 263 ng per DL-considered acceptable target for him.    He is advised to continue testosterone   50 mg IM every 10 days until next measurement.    It is essential to balance his target testosterone  for safety reasons. His target level testosterone  is 250-350 .   2. Type 2 diabetes with vascular complications  - Patient has currently controlled asymptomatic type 2 DM since  58 years of age.  He presents with his meter and logs showing control of glycemic profile to target averaging 100-135 mg per DL since last visit 2 weeks ago.  His recent A1c was 6% improving from 10.1%.     His diabetes is complicated by complicated by coronary artery disease status post triple bypass and mitral valve repair and patient remains at exceedingly high risk for more acute and chronic complications of diabetes  which include CAD, CVA, CKD, retinopathy, and neuropathy. These are all discussed in detail with the patient.  -  He acknowledges that there is a  room for improvement in his food and drink choices. - Suggestion is made for him to avoid simple carbohydrates  from his diet including Cakes, Sweet Desserts, Ice Cream, Soda (diet and regular), Sweet Tea, Candies, Chips, Cookies, Store Bought Juices, Alcohol in Excess of  1-2 drinks a day, Artificial Sweeteners,  Coffee Creamer, and Sugar-free Products, Lemonade. This will help patient to have more stable blood glucose profile and potentially avoid unintended weight gain.     - Patient is advised to stick to a routine mealtimes to eat 3 meals  a day and avoid unnecessary snacks ( to snack only to correct hypoglycemia).  - I have approached patient with the following individualized plan to manage diabetes and patient agrees: -Based on his presentation with near target glycemic profile, he would not need insulin treatment for now.  He is advised to continue Trulicity  4.5 mg subcutaneously weekly.  At this time, he is advised to hold metformin  for now.    He is advised in willing to monitor blood glucose twice a day-daily before breakfast and at bedtime.  He would  also benefit from a CGM, freestyle libre 3+ device was prescribed for him.       - He is also advised to call clinic for hypoglycemia under 70 or hyperglycemia above 200 mg per DL Premeal 3 x 0.    -He is advised to continue Jardiance  25 mg p.o. daily at breakfast. Side effects of these medications were discussed with him.  - Patient specific target  A1c;  LDL, HDL, Triglycerides, and  Waist Circumference were discussed in detail.  3) BP/HTN:   His blood pressure is controlled to target.  He is on metoprolol 50 mg p.o. twice daily.     He will not tolerate any more blood pressure lowering medications at this time.  4) Lipids/HPL:  Recent lipid panel showed LDL controlled at 52.   He is advised to continue Lipitor 20 mg p.o. nightly.     Side effects and precautions discussed with him.  He is advised to avoid fried foods and  butter.  5)  Weight/Diet:  His Body mass index is 32.69 kg/m.-He has achieved significant weight loss over the last several weeks/months.  His high BMI still current complicating his diabetes care.  He is a candidate for modest weight loss.   CDE Consult has been requested, exercise, and detailed carbohydrates information provided.  - His repeat MRI of sella/pituitary in 2017- is consistent with postsurgical changes with no new lesion. Summaries dictated,  see above.   - He is status post sinus drainage by ENT with clinical improvement. He does not have any clinical complaints of mass-effect at this time.  6) Vitamin D  deficiency: His most recent vitamin D  level was 14.  He is advised to resume and continue vitamin D3 2000 units daily.     7) Chronic Care/Health Maintenance:   Patient on Statin medications and encouraged to continue to follow up with Ophthalmology, Podiatrist at least yearly or according to recommendations, and advised to   stay away from smoking. I have recommended yearly flu vaccine and pneumonia vaccination at least every 5 years; moderate intensity exercise for up to 150 minutes weekly; and  sleep for at least 7 hours a day.  He is advised to continue vitamin D3 5000 units daily.  - I advised patient to maintain close follow up with Diedra Senior, MD for primary care needs.   I spent  43  minutes in the care of the patient today including review of labs from Thyroid  Function, CMP, and other relevant labs ; imaging/biopsy records (current and previous including abstractions from other facilities); face-to-face time discussing  his lab results and symptoms, medications doses, his options of short and long term treatment based on the latest standards of care / guidelines;   and documenting the encounter.  Alm JONELLE Rase  participated in the discussions, expressed understanding, and voiced agreement with the above plans.  All questions were answered to his satisfaction. he  is encouraged to contact clinic should he have any questions or concerns prior to his return visit.   Follow up plan: - Return in about 4 months (around 12/22/2024) for Fasting Labs  in AM B4 8.  [place], [facility] [address] [place], [place] [address] Phone: [phone] Fax: [phone] 08/23/2024, 5:22 PM

## 2024-09-18 LAB — COLOGUARD: COLOGUARD: NEGATIVE

## 2024-09-21 ENCOUNTER — Other Ambulatory Visit: Payer: Self-pay | Admitting: "Endocrinology

## 2024-10-09 ENCOUNTER — Other Ambulatory Visit: Payer: Self-pay | Admitting: "Endocrinology

## 2024-12-24 ENCOUNTER — Ambulatory Visit: Admitting: "Endocrinology
